# Patient Record
Sex: Female | Born: 1960 | Race: Black or African American | Hispanic: No | Smoking: Current every day smoker
Health system: Southern US, Community
[De-identification: ages and names within clinical notes are randomized; demographics above are authoritative.]

## PROBLEM LIST (undated history)

## (undated) DIAGNOSIS — J449 Chronic obstructive pulmonary disease, unspecified: Secondary | ICD-10-CM

## (undated) DIAGNOSIS — I10 Essential (primary) hypertension: Secondary | ICD-10-CM

## (undated) DIAGNOSIS — G5600 Carpal tunnel syndrome, unspecified upper limb: Secondary | ICD-10-CM

## (undated) DIAGNOSIS — M1990 Unspecified osteoarthritis, unspecified site: Secondary | ICD-10-CM

## (undated) HISTORY — PX: OTHER SURGICAL HISTORY: SHX169

## (undated) HISTORY — PX: TUBAL LIGATION: SHX77

---

## 2010-07-27 ENCOUNTER — Emergency Department (HOSPITAL_COMMUNITY)
Admission: EM | Admit: 2010-07-27 | Discharge: 2010-07-27 | Disposition: A | Discharge status: Home / Self Care | Payer: Medicaid - Out of State | Attending: Emergency Medicine | Admitting: Emergency Medicine

## 2010-07-27 DIAGNOSIS — M069 Rheumatoid arthritis, unspecified: Secondary | ICD-10-CM | POA: Insufficient documentation

## 2010-07-27 DIAGNOSIS — Z79899 Other long term (current) drug therapy: Secondary | ICD-10-CM | POA: Insufficient documentation

## 2010-07-27 DIAGNOSIS — M2550 Pain in unspecified joint: Secondary | ICD-10-CM | POA: Insufficient documentation

## 2011-04-10 ENCOUNTER — Emergency Department (HOSPITAL_COMMUNITY)
Admission: EM | Admit: 2011-04-10 | Discharge: 2011-04-10 | Disposition: A | Discharge status: Home / Self Care | Payer: Medicaid Other | Attending: Emergency Medicine | Admitting: Emergency Medicine

## 2011-04-10 ENCOUNTER — Encounter (HOSPITAL_COMMUNITY): Payer: Self-pay | Admitting: Nurse Practitioner

## 2011-04-10 ENCOUNTER — Emergency Department (HOSPITAL_COMMUNITY): Payer: Medicaid Other

## 2011-04-10 DIAGNOSIS — F17200 Nicotine dependence, unspecified, uncomplicated: Secondary | ICD-10-CM | POA: Insufficient documentation

## 2011-04-10 DIAGNOSIS — R0602 Shortness of breath: Secondary | ICD-10-CM | POA: Insufficient documentation

## 2011-04-10 DIAGNOSIS — J069 Acute upper respiratory infection, unspecified: Secondary | ICD-10-CM | POA: Insufficient documentation

## 2011-04-10 DIAGNOSIS — M129 Arthropathy, unspecified: Secondary | ICD-10-CM | POA: Insufficient documentation

## 2011-04-10 DIAGNOSIS — R0989 Other specified symptoms and signs involving the circulatory and respiratory systems: Secondary | ICD-10-CM | POA: Insufficient documentation

## 2011-04-10 DIAGNOSIS — R197 Diarrhea, unspecified: Secondary | ICD-10-CM | POA: Insufficient documentation

## 2011-04-10 DIAGNOSIS — R0609 Other forms of dyspnea: Secondary | ICD-10-CM | POA: Insufficient documentation

## 2011-04-10 DIAGNOSIS — R52 Pain, unspecified: Secondary | ICD-10-CM | POA: Insufficient documentation

## 2011-04-10 DIAGNOSIS — IMO0001 Reserved for inherently not codable concepts without codable children: Secondary | ICD-10-CM | POA: Insufficient documentation

## 2011-04-10 DIAGNOSIS — R509 Fever, unspecified: Secondary | ICD-10-CM | POA: Insufficient documentation

## 2011-04-10 DIAGNOSIS — R5381 Other malaise: Secondary | ICD-10-CM | POA: Insufficient documentation

## 2011-04-10 DIAGNOSIS — Z79899 Other long term (current) drug therapy: Secondary | ICD-10-CM | POA: Insufficient documentation

## 2011-04-10 DIAGNOSIS — R059 Cough, unspecified: Secondary | ICD-10-CM | POA: Insufficient documentation

## 2011-04-10 DIAGNOSIS — R05 Cough: Secondary | ICD-10-CM | POA: Insufficient documentation

## 2011-04-10 HISTORY — DX: Unspecified osteoarthritis, unspecified site: M19.90

## 2011-04-10 HISTORY — DX: Carpal tunnel syndrome, unspecified upper limb: G56.00

## 2011-04-10 MED ORDER — ALBUTEROL SULFATE (5 MG/ML) 0.5% IN NEBU
2.5000 mg | INHALATION_SOLUTION | RESPIRATORY_TRACT | Status: AC
  Administered 2011-04-10: 2.5 mg via RESPIRATORY_TRACT
  Filled 2011-04-10: qty 0.5

## 2011-04-10 MED ORDER — AZITHROMYCIN 250 MG PO TABS: 250.0000 mg | ORAL_TABLET | Freq: Every day | ORAL | Status: AC

## 2011-04-10 MED ORDER — ALBUTEROL SULFATE HFA 108 (90 BASE) MCG/ACT IN AERS: 2.0000 | INHALATION_SPRAY | Freq: Four times a day (QID) | RESPIRATORY_TRACT | Status: DC

## 2011-04-10 MED ORDER — PREDNISONE 20 MG PO TABS
60.0000 mg | ORAL_TABLET | Freq: Once | ORAL | Status: AC
  Administered 2011-04-10: 60 mg via ORAL
  Filled 2011-04-10: qty 3

## 2011-04-10 MED ORDER — AZITHROMYCIN 250 MG PO TABS
500.0000 mg | ORAL_TABLET | Freq: Once | ORAL | Status: AC
  Administered 2011-04-10: 500 mg via ORAL
  Filled 2011-04-10: qty 2

## 2011-04-10 MED ORDER — PREDNISONE 10 MG PO TABS: 50.0000 mg | ORAL_TABLET | Freq: Every day | ORAL | Status: AC

## 2011-04-10 MED ORDER — KETOROLAC TROMETHAMINE 30 MG/ML IJ SOLN
30.0000 mg | Freq: Once | INTRAMUSCULAR | Status: AC
  Administered 2011-04-10: 30 mg via INTRAVENOUS
  Filled 2011-04-10: qty 1

## 2011-04-10 NOTE — ED Notes (Signed)
C/o sob, body aches and diarrhea x 2 days. Able to speak in full unlabored sentences

## 2011-04-10 NOTE — ED Provider Notes (Signed)
History     CSN: [id number]  Arrival date & time 04/10/11  1016   First MD Initiated Contact with Patient 04/10/11 1106      Chief Complaint  Patient presents with  . Shortness of Breath  . Generalized Body Aches  . Diarrhea    HPI The patient presents with 2-3 days of complaints.  Her symptoms began gradually.  Since onset she has had fatigue, dyspnea, cough, diffuse myalgia, diarrhea.  She notes no relief with OTC medications.  Symptoms seem to be worsening on their own.  She also complains of fevers and chills, though there is no objective fever.  She denies any confusion, disorientation, vomiting, visual loss or changes.  Past Medical History  Diagnosis Date  . Arthritis   . Carpal tunnel syndrome     Past Surgical History  Procedure Date  . Tubal ligation     No family history on file.  History  Substance Use Topics  . Smoking status: Current Everyday Smoker -- 0.5 packs/day  . Smokeless tobacco: Not on file  . Alcohol Use: No    OB History    Grav Para Term Preterm Abortions TAB SAB Ect Mult Living                  Review of Systems  Constitutional:       HPI  HENT:       HPI otherwise negative  Eyes: Negative.   Respiratory:       HPI, otherwise negative  Cardiovascular:       HPI, otherwise nmegative  Gastrointestinal: Negative for vomiting.  Genitourinary:       HPI, otherwise negative  Musculoskeletal:       HPI, otherwise negative  Skin: Negative.   Neurological: Negative for syncope.    Allergies  Review of patient's allergies indicates no known allergies.  Home Medications   Current Outpatient Rx  Name Route Sig Dispense Refill  . DESOXIMETASONE 0.25 % EX CREA Topical Apply 1 application topically 2 (two) times daily as needed. For rash    . GABAPENTIN 300 MG PO CAPS Oral Take 300 mg by mouth 3 (three) times daily.    Marland Kitchen HYDROXYCHLOROQUINE SULFATE 200 MG PO TABS Oral Take 200 mg by mouth daily.    Marland Kitchen HYDROXYZINE HCL 25 MG PO TABS Oral  Take 25 mg by mouth 3 (three) times daily as needed. For itching.    . MELOXICAM 15 MG PO TABS Oral Take 15 mg by mouth 2 (two) times daily.     . DAYQUIL PO Oral Take 30 mLs by mouth once.      BP 130/78  Pulse 92  Temp(Src) 98.3 F (36.8 C) (Oral)  Resp 20  Ht 5\' 5"  (1.651 m)  Wt 210 lb (95.255 kg)  BMI 34.95 kg/m2  SpO2 96%  Physical Exam  Nursing note and vitals reviewed. Constitutional: She is oriented to person, place, and time. She appears well-developed and well-nourished. No distress.  HENT:  Head: Normocephalic and atraumatic.  Eyes: Conjunctivae and EOM are normal.  Cardiovascular: Normal rate and regular rhythm.   Pulmonary/Chest: Effort normal. No stridor. No respiratory distress. She has decreased breath sounds. She has wheezes.  Abdominal: She exhibits no distension.  Musculoskeletal: She exhibits no edema.  Neurological: She is alert and oriented to person, place, and time. No cranial nerve deficit.  Skin: Skin is warm and dry.  Psychiatric: She has a normal mood and affect.    ED Course  Procedures (including critical care time)  Labs Reviewed - No data to display No results found.   No diagnosis found.   X-ray reviewed by me MDM  This female presents with several days of myalgias, fatigue, cough and congestion.  On exam the patient is in no distress though there is wheezing with decreased breath sounds bilaterally.  The patient's vital signs are stable.  Patient's x-ray does not demonstrate pneumonia.  The patient had some relief with a mural inhaler and injected Toradol.  The patient's symptoms are most consistent with URI plus minus bronchitis.  There is low suspicion for systemic infection, given the absence of objective fever, any evidence of distress.  Given the patient's smoking history, she was discharged with an albuterol inhaler, steroids, Z-Pak to follow up with her primary care physician.       Gerhard Munch, MD 04/10/11 [id number]

## 2011-04-10 NOTE — ED Notes (Signed)
Patient transported to X-ray 

## 2011-07-23 ENCOUNTER — Emergency Department (HOSPITAL_COMMUNITY)
Admission: EM | Admit: 2011-07-23 | Discharge: 2011-07-23 | Disposition: A | Discharge status: Home / Self Care | Payer: Medicaid Other | Attending: Emergency Medicine | Admitting: Emergency Medicine

## 2011-07-23 ENCOUNTER — Encounter (HOSPITAL_COMMUNITY): Payer: Self-pay | Admitting: Emergency Medicine

## 2011-07-23 DIAGNOSIS — M25579 Pain in unspecified ankle and joints of unspecified foot: Secondary | ICD-10-CM | POA: Insufficient documentation

## 2011-07-23 DIAGNOSIS — M129 Arthropathy, unspecified: Secondary | ICD-10-CM | POA: Insufficient documentation

## 2011-07-23 DIAGNOSIS — F17200 Nicotine dependence, unspecified, uncomplicated: Secondary | ICD-10-CM | POA: Insufficient documentation

## 2011-07-23 DIAGNOSIS — M7989 Other specified soft tissue disorders: Secondary | ICD-10-CM

## 2011-07-23 DIAGNOSIS — Z79899 Other long term (current) drug therapy: Secondary | ICD-10-CM | POA: Insufficient documentation

## 2011-07-23 DIAGNOSIS — M79609 Pain in unspecified limb: Secondary | ICD-10-CM

## 2011-07-23 DIAGNOSIS — M1990 Unspecified osteoarthritis, unspecified site: Secondary | ICD-10-CM

## 2011-07-23 LAB — URINE MICROSCOPIC-ADD ON

## 2011-07-23 LAB — PRO B NATRIURETIC PEPTIDE: Pro B Natriuretic peptide (BNP): 75 pg/mL (ref 0–125)

## 2011-07-23 LAB — BASIC METABOLIC PANEL
BUN: 18 mg/dL (ref 6–23)
CO2: 22 mEq/L (ref 19–32)
Chloride: 106 mEq/L (ref 96–112)
Creatinine, Ser: 0.89 mg/dL (ref 0.50–1.10)
Potassium: 4 mEq/L (ref 3.5–5.1)

## 2011-07-23 LAB — URINALYSIS, ROUTINE W REFLEX MICROSCOPIC
Bilirubin Urine: NEGATIVE
Glucose, UA: NEGATIVE mg/dL
Hgb urine dipstick: NEGATIVE
Ketones, ur: NEGATIVE mg/dL
Specific Gravity, Urine: 1.015 (ref 1.005–1.030)
pH: 6 (ref 5.0–8.0)

## 2011-07-23 MED ORDER — FUROSEMIDE 20 MG PO TABS: 20.0000 mg | ORAL_TABLET | Freq: Two times a day (BID) | ORAL | Status: AC

## 2011-07-23 MED ORDER — HYDROCODONE-ACETAMINOPHEN 5-325 MG PO TABS: 2.0000 | ORAL_TABLET | ORAL | Status: AC | PRN

## 2011-07-23 NOTE — ED Provider Notes (Signed)
History     CSN: [id number]  Arrival date & time 07/23/11  7829   First MD Initiated Contact with Patient 07/23/11 0906      No chief complaint on file.   HPI Pt with hx of RA has increase pain to right leg knee to the ankle since Tuesday. Pt right knee is swollen. Pt has pulses and sensation present. Pt ambulated for 2 hours yesterday and feels that made the problem worse.     Past Medical History  Diagnosis Date  . Arthritis   . Carpal tunnel syndrome     Past Surgical History  Procedure Date  . Tubal ligation     No family history on file.  History  Substance Use Topics  . Smoking status: Current Everyday Smoker -- 0.5 packs/day  . Smokeless tobacco: Not on file  . Alcohol Use: No    OB History    Grav Para Term Preterm Abortions TAB SAB Ect Mult Living                  Review of Systems  All other systems reviewed and are negative.    Allergies  Review of patient's allergies indicates no known allergies.  Home Medications   Current Outpatient Rx  Name Route Sig Dispense Refill  . ALBUTEROL SULFATE HFA 108 (90 BASE) MCG/ACT IN AERS Inhalation Inhale 2 puffs into the lungs every 6 (six) hours as needed. Shortness of breath    . BECLOMETHASONE DIPROPIONATE 80 MCG/ACT IN AERS Inhalation Inhale 1 puff into the lungs as needed. Prevent asthma    . DESOXIMETASONE 0.25 % EX CREA Topical Apply 1 application topically 2 (two) times daily as needed. For rash    . GABAPENTIN 300 MG PO CAPS Oral Take 300 mg by mouth 3 (three) times daily.    Marland Kitchen HYDROXYCHLOROQUINE SULFATE 200 MG PO TABS Oral Take 200 mg by mouth daily.    Marland Kitchen HYDROXYZINE HCL 25 MG PO TABS Oral Take 25 mg by mouth 3 (three) times daily as needed. For itching.    . MELOXICAM 15 MG PO TABS Oral Take 15 mg by mouth 2 (two) times daily.     . FUROSEMIDE 20 MG PO TABS Oral Take 1 tablet (20 mg total) by mouth 2 (two) times daily. 15 tablet 0  . HYDROCODONE-ACETAMINOPHEN 5-325 MG PO TABS Oral Take 2 tablets  by mouth every 4 (four) hours as needed for pain. 10 tablet 0    BP 141/56  Pulse 69  Temp 97.8 F (36.6 C) (Oral)  Resp 20  SpO2 100%  Physical Exam  Nursing note and vitals reviewed. Constitutional: She is oriented to person, place, and time. She appears well-developed and well-nourished. No distress.  HENT:  Head: Normocephalic and atraumatic.  Eyes: Pupils are equal, round, and reactive to light.  Neck: Normal range of motion.  Cardiovascular: Normal rate and intact distal pulses.   Pulmonary/Chest: No respiratory distress.  Abdominal: Normal appearance. She exhibits no distension.  Musculoskeletal:       Right knee: She exhibits swelling.       Right upper leg: She exhibits swelling.       Right lower leg: She exhibits edema.  Neurological: She is alert and oriented to person, place, and time. No cranial nerve deficit.  Skin: Skin is warm and dry. No rash noted.  Psychiatric: She has a normal mood and affect. Her behavior is normal.    ED Course  Procedures (including critical care time)  Venous Doppler duplex of extremities showed no evidence of DVT or Baker's cyst Labs Reviewed  URINALYSIS, ROUTINE W REFLEX MICROSCOPIC - Abnormal; Notable for the following:    Leukocytes, UA MODERATE (*)     All other components within normal limits  BASIC METABOLIC PANEL - Abnormal; Notable for the following:    GFR calc non Af Amer 75 (*)     GFR calc Af Amer 87 (*)     All other components within normal limits  URINE MICROSCOPIC-ADD ON - Abnormal; Notable for the following:    Squamous Epithelial / LPF FEW (*)     Bacteria, UA FEW (*)     All other components within normal limits  PRO B NATRIURETIC PEPTIDE   No results found.   1. Arthritis       MDM          Nelia Shi, MD 07/23/11 1149

## 2011-07-23 NOTE — ED Notes (Signed)
Per Noreene Larsson with [place], pt is negative for DVT.

## 2011-07-23 NOTE — ED Notes (Signed)
Pt unable to get ride, given bus pass.

## 2011-07-23 NOTE — ED Notes (Signed)
Pt with hx of RA has increase pain to right leg knee to the ankle since Tuesday. Pt right knee is swollen. Pt has pulses and sensation present. Pt ambulated for 2 hours yesterday and feels that made the problem worse.

## 2011-07-23 NOTE — Discharge Instructions (Signed)

## 2011-07-23 NOTE — ED Notes (Signed)
Pt came by ambulance, unable to reach family for ride. Pt trying to reach friend.

## 2011-07-23 NOTE — Progress Notes (Signed)
*  PRELIMINARY RESULTS* Vascular Ultrasound Right lower extremity venous duplex has been completed.  Preliminary findings: Right= No evidence of DVT or baker's cyst.  EUNICE, Dandra Shambaugh RDMS 07/23/2011, 9:44 AM

## 2011-09-16 ENCOUNTER — Ambulatory Visit
Admission: RE | Admit: 2011-09-16 | Discharge: 2011-09-16 | Disposition: A | Discharge status: Home / Self Care | Payer: Medicaid Other | Source: Ambulatory Visit | Attending: Family Medicine | Admitting: Family Medicine

## 2011-09-16 ENCOUNTER — Other Ambulatory Visit: Payer: Self-pay | Admitting: Family Medicine

## 2011-09-16 DIAGNOSIS — M25561 Pain in right knee: Secondary | ICD-10-CM

## 2012-03-26 ENCOUNTER — Encounter (HOSPITAL_COMMUNITY): Payer: Self-pay | Admitting: Emergency Medicine

## 2012-03-26 ENCOUNTER — Emergency Department (HOSPITAL_COMMUNITY)
Admission: EM | Admit: 2012-03-26 | Discharge: 2012-03-26 | Disposition: A | Discharge status: Home / Self Care | Payer: Medicaid Other | Attending: Emergency Medicine | Admitting: Emergency Medicine

## 2012-03-26 DIAGNOSIS — Z79899 Other long term (current) drug therapy: Secondary | ICD-10-CM | POA: Insufficient documentation

## 2012-03-26 DIAGNOSIS — F17200 Nicotine dependence, unspecified, uncomplicated: Secondary | ICD-10-CM | POA: Insufficient documentation

## 2012-03-26 DIAGNOSIS — M25549 Pain in joints of unspecified hand: Secondary | ICD-10-CM | POA: Insufficient documentation

## 2012-03-26 DIAGNOSIS — Z760 Encounter for issue of repeat prescription: Secondary | ICD-10-CM

## 2012-03-26 DIAGNOSIS — M069 Rheumatoid arthritis, unspecified: Secondary | ICD-10-CM | POA: Insufficient documentation

## 2012-03-26 DIAGNOSIS — Z8742 Personal history of other diseases of the female genital tract: Secondary | ICD-10-CM | POA: Insufficient documentation

## 2012-03-26 LAB — CBC
HCT: 37 % (ref 36.0–46.0)
Hemoglobin: 12.9 g/dL (ref 12.0–15.0)
MCHC: 34.9 g/dL (ref 30.0–36.0)
RDW: 14.2 % (ref 11.5–15.5)
WBC: 7.3 10*3/uL (ref 4.0–10.5)

## 2012-03-26 LAB — POCT I-STAT, CHEM 8
Chloride: 109 mEq/L (ref 96–112)
Glucose, Bld: 117 mg/dL — ABNORMAL HIGH (ref 70–99)
HCT: 38 % (ref 36.0–46.0)
Hemoglobin: 12.9 g/dL (ref 12.0–15.0)
Potassium: 3.9 mEq/L (ref 3.5–5.1)
Sodium: 144 mEq/L (ref 135–145)

## 2012-03-26 MED ORDER — GABAPENTIN 600 MG PO TABS
600.0000 mg | ORAL_TABLET | Freq: Once | ORAL | Status: AC
  Administered 2012-03-26: 600 mg via ORAL
  Filled 2012-03-26: qty 1

## 2012-03-26 MED ORDER — GABAPENTIN 600 MG PO TABS: 600.0000 mg | ORAL_TABLET | Freq: Three times a day (TID) | ORAL | Status: AC

## 2012-03-26 MED ORDER — HYDROXYCHLOROQUINE SULFATE 200 MG PO TABS
200.0000 mg | ORAL_TABLET | Freq: Once | ORAL | Status: AC
  Administered 2012-03-26: 200 mg via ORAL
  Filled 2012-03-26: qty 1

## 2012-03-26 MED ORDER — MELOXICAM 15 MG PO TABS: 15.0000 mg | ORAL_TABLET | Freq: Every day | ORAL | Status: DC

## 2012-03-26 MED ORDER — TRAMADOL HCL 50 MG PO TABS
50.0000 mg | ORAL_TABLET | Freq: Once | ORAL | Status: AC
  Administered 2012-03-26: 50 mg via ORAL
  Filled 2012-03-26: qty 1

## 2012-03-26 MED ORDER — MELOXICAM 15 MG PO TABS
15.0000 mg | ORAL_TABLET | Freq: Once | ORAL | Status: AC
  Administered 2012-03-26: 15 mg via ORAL
  Filled 2012-03-26: qty 1

## 2012-03-26 MED ORDER — FUROSEMIDE 20 MG PO TABS: 20.0000 mg | ORAL_TABLET | Freq: Two times a day (BID) | ORAL | Status: DC

## 2012-03-26 MED ORDER — TRAMADOL HCL 50 MG PO TABS: 50.0000 mg | ORAL_TABLET | Freq: Four times a day (QID) | ORAL | Status: DC | PRN

## 2012-03-26 MED ORDER — FUROSEMIDE 20 MG PO TABS
20.0000 mg | ORAL_TABLET | Freq: Once | ORAL | Status: AC
  Administered 2012-03-26: 20 mg via ORAL
  Filled 2012-03-26: qty 1

## 2012-03-26 MED ORDER — HYDROXYCHLOROQUINE SULFATE 200 MG PO TABS: 200.0000 mg | ORAL_TABLET | Freq: Every day | ORAL | Status: DC

## 2012-03-26 NOTE — ED Provider Notes (Signed)
History     CSN: [id number]  Arrival date & time 03/26/12  2128   First MD Initiated Contact with Patient 03/26/12 2151      Chief Complaint  Patient presents with  . Joint Pain    (Consider location/radiation/quality/duration/timing/severity/associated sxs/prior treatment) The history is provided by the patient. No language interpreter was used.   Cc: 52 year-old female coming in with joint pain including her knees and fingers. Past medical history of rheumatoid arthritis out of most  her medications for a week and a half. Patient's next appointment at [facility] with Dr. Renae Gloss is March 27. She is asking to get 5 of her medications refilled until then. Denies fever nausea or vomiting.   Past Medical History  Diagnosis Date  . Arthritis   . Carpal tunnel syndrome     Past Surgical History  Procedure Laterality Date  . Tubal ligation      History reviewed. No pertinent family history.  History  Substance Use Topics  . Smoking status: Current Every Day Smoker -- 0.50 packs/day  . Smokeless tobacco: Not on file  . Alcohol Use: No    OB History   Grav Para Term Preterm Abortions TAB SAB Ect Mult Living                  Review of Systems  Constitutional: Negative.  Negative for fever and diaphoresis.  HENT: Negative.   Eyes: Negative.   Respiratory: Negative.  Negative for shortness of breath.   Cardiovascular: Negative.   Gastrointestinal: Negative.  Negative for nausea and vomiting.  Musculoskeletal: Positive for joint swelling.       Right knee swelling  Neurological: Negative.  Negative for dizziness and weakness.  Psychiatric/Behavioral: Negative.   All other systems reviewed and are negative.    Allergies  Review of patient's allergies indicates no known allergies.  Home Medications   Current Outpatient Rx  Name  Route  Sig  Dispense  Refill  . albuterol (PROVENTIL HFA;VENTOLIN HFA) 108 (90 BASE) MCG/ACT inhaler   Inhalation    Inhale 2 puffs into the lungs every 6 (six) hours as needed. Shortness of breath         . beclomethasone (QVAR) 80 MCG/ACT inhaler   Inhalation   Inhale 1 puff into the lungs as needed. Prevent asthma         . desoximetasone (TOPICORT) 0.25 % cream   Topical   Apply 1 application topically 2 (two) times daily as needed. For rash         . furosemide (LASIX) 20 MG tablet   Oral   Take 1 tablet (20 mg total) by mouth 2 (two) times daily.   15 tablet   0   . gabapentin (NEURONTIN) 300 MG capsule   Oral   Take 300 mg by mouth 3 (three) times daily.         . hydroxychloroquine (PLAQUENIL) 200 MG tablet   Oral   Take 200 mg by mouth daily.         . hydrOXYzine (ATARAX/VISTARIL) 25 MG tablet   Oral   Take 25 mg by mouth 3 (three) times daily as needed. For itching.         . meloxicam (MOBIC) 15 MG tablet   Oral   Take 15 mg by mouth 2 (two) times daily.            BP 150/68  Pulse 68  Temp(Src) 98.8 F (37.1 C) (Oral)  Resp 16  SpO2 98%  Physical Exam  Nursing note and vitals reviewed. Constitutional: She is oriented to person, place, and time. She appears well-developed and well-nourished.  HENT:  Head: Normocephalic and atraumatic.  Eyes: Conjunctivae and EOM are normal. Pupils are equal, round, and reactive to light.  Neck: Normal range of motion. Neck supple.  Cardiovascular: Normal rate.   Pulmonary/Chest: Effort normal.  Abdominal: Soft.  Musculoskeletal: Normal range of motion. She exhibits edema and tenderness.  Right anterior knee edema cool to touch and swelling to her right hand index and third fingers cool to touch  Neurological: She is alert and oriented to person, place, and time. She has normal reflexes.  Skin: Skin is warm and dry.  Psychiatric: She has a normal mood and affect.    ED Course  Procedures (including critical care time)  Labs Reviewed  CBC  POCT I-STAT, CHEM 8   No results found.   No diagnosis  found.    MDM  R/A out of meds.  Prescriptions for a mother ultrasound white female in Neurontin and Lasix. Patient will followup with her PCP next month. Labs unremarkable today no infected joints.   Labs Reviewed  POCT I-STAT, CHEM 8 - Abnormal; Notable for the following:    Glucose, Bld 117 (*)    All other components within normal limits  CBC          Remi Haggard, NP 03/26/12 2256

## 2012-03-26 NOTE — ED Notes (Signed)
Patient here with husband.  Patient states that she is having joint pain in arms and legs.  Patient is CAOx3.

## 2012-03-27 NOTE — ED Provider Notes (Signed)
Medical screening examination/treatment/procedure(s) were performed by non-physician practitioner and as supervising physician I was immediately available for consultation/collaboration.   Carleene Cooper III, MD 03/27/12 2004

## 2012-05-05 ENCOUNTER — Other Ambulatory Visit: Payer: Self-pay | Admitting: Nurse Practitioner

## 2012-05-05 ENCOUNTER — Ambulatory Visit
Admission: RE | Admit: 2012-05-05 | Discharge: 2012-05-05 | Disposition: A | Discharge status: Home / Self Care | Payer: Medicaid Other | Source: Ambulatory Visit | Attending: Nurse Practitioner | Admitting: Nurse Practitioner

## 2012-05-05 DIAGNOSIS — R0602 Shortness of breath: Secondary | ICD-10-CM

## 2012-05-05 DIAGNOSIS — J449 Chronic obstructive pulmonary disease, unspecified: Secondary | ICD-10-CM

## 2012-05-05 DIAGNOSIS — R062 Wheezing: Secondary | ICD-10-CM

## 2012-09-01 ENCOUNTER — Other Ambulatory Visit: Payer: Self-pay | Admitting: Internal Medicine

## 2012-09-01 DIAGNOSIS — Z1231 Encounter for screening mammogram for malignant neoplasm of breast: Secondary | ICD-10-CM

## 2012-09-08 ENCOUNTER — Ambulatory Visit
Admission: RE | Admit: 2012-09-08 | Discharge: 2012-09-08 | Disposition: A | Discharge status: Home / Self Care | Payer: Medicaid Other | Source: Ambulatory Visit | Attending: Internal Medicine | Admitting: Internal Medicine

## 2012-09-08 DIAGNOSIS — Z1231 Encounter for screening mammogram for malignant neoplasm of breast: Secondary | ICD-10-CM

## 2013-01-01 ENCOUNTER — Emergency Department (HOSPITAL_COMMUNITY): Payer: No Typology Code available for payment source

## 2013-01-01 ENCOUNTER — Encounter (HOSPITAL_COMMUNITY): Payer: Self-pay | Admitting: Emergency Medicine

## 2013-01-01 ENCOUNTER — Emergency Department (HOSPITAL_COMMUNITY)
Admission: EM | Admit: 2013-01-01 | Discharge: 2013-01-01 | Disposition: A | Discharge status: Home / Self Care | Payer: No Typology Code available for payment source | Attending: Emergency Medicine | Admitting: Emergency Medicine

## 2013-01-01 DIAGNOSIS — M25539 Pain in unspecified wrist: Secondary | ICD-10-CM | POA: Insufficient documentation

## 2013-01-01 DIAGNOSIS — I10 Essential (primary) hypertension: Secondary | ICD-10-CM | POA: Insufficient documentation

## 2013-01-01 DIAGNOSIS — F17200 Nicotine dependence, unspecified, uncomplicated: Secondary | ICD-10-CM | POA: Insufficient documentation

## 2013-01-01 DIAGNOSIS — Z79899 Other long term (current) drug therapy: Secondary | ICD-10-CM | POA: Insufficient documentation

## 2013-01-01 DIAGNOSIS — M545 Low back pain, unspecified: Secondary | ICD-10-CM | POA: Insufficient documentation

## 2013-01-01 DIAGNOSIS — Y939 Activity, unspecified: Secondary | ICD-10-CM | POA: Insufficient documentation

## 2013-01-01 DIAGNOSIS — Z8739 Personal history of other diseases of the musculoskeletal system and connective tissue: Secondary | ICD-10-CM | POA: Insufficient documentation

## 2013-01-01 DIAGNOSIS — M129 Arthropathy, unspecified: Secondary | ICD-10-CM | POA: Insufficient documentation

## 2013-01-01 DIAGNOSIS — M62838 Other muscle spasm: Secondary | ICD-10-CM | POA: Insufficient documentation

## 2013-01-01 DIAGNOSIS — Y92410 Unspecified street and highway as the place of occurrence of the external cause: Secondary | ICD-10-CM | POA: Insufficient documentation

## 2013-01-01 HISTORY — DX: Essential (primary) hypertension: I10

## 2013-01-01 MED ORDER — OXYCODONE-ACETAMINOPHEN 5-325 MG PO TABS: 2.0000 | ORAL_TABLET | ORAL | Status: DC | PRN

## 2013-01-01 MED ORDER — DIAZEPAM 5 MG PO TABS: 5.0000 mg | ORAL_TABLET | Freq: Two times a day (BID) | ORAL | Status: DC

## 2013-01-01 MED ORDER — DIAZEPAM 5 MG PO TABS
5.0000 mg | ORAL_TABLET | Freq: Once | ORAL | Status: AC
  Administered 2013-01-01: 5 mg via ORAL
  Filled 2013-01-01: qty 1

## 2013-01-01 MED ORDER — KETOROLAC TROMETHAMINE 60 MG/2ML IM SOLN
60.0000 mg | Freq: Once | INTRAMUSCULAR | Status: AC
  Administered 2013-01-01: 60 mg via INTRAMUSCULAR
  Filled 2013-01-01: qty 2

## 2013-01-01 NOTE — ED Notes (Signed)
Contacted xray regarding delay. Provider and pt updated

## 2013-01-01 NOTE — ED Notes (Signed)
Pt returned from xray
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
## 2013-01-01 NOTE — ED Notes (Signed)
Pt comfortable with d/c and f/u instructions. Prescriptions x2. 
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
## 2013-01-02 NOTE — ED Provider Notes (Signed)
Medical screening examination/treatment/procedure(s) were performed by non-physician practitioner and as supervising physician I was immediately available for consultation/collaboration.  Damyon Mullane B. Amra Shukla, MD 01/02/13 0706 

## 2013-01-02 NOTE — ED Provider Notes (Signed)
Medical screening examination/treatment/procedure(s) were performed by non-physician practitioner and as supervising physician I was immediately available for consultation/collaboration.  EKG Interpretation   None         Deshone Lyssy J Morene Cecilio, MD 01/02/13 2246 

## 2013-08-20 ENCOUNTER — Other Ambulatory Visit: Payer: Self-pay

## 2013-08-20 DIAGNOSIS — Z1231 Encounter for screening mammogram for malignant neoplasm of breast: Secondary | ICD-10-CM

## 2013-09-10 ENCOUNTER — Ambulatory Visit: Payer: Medicaid Other

## 2013-09-12 ENCOUNTER — Ambulatory Visit: Payer: Medicaid Other

## 2013-09-20 ENCOUNTER — Ambulatory Visit
Admission: RE | Admit: 2013-09-20 | Discharge: 2013-09-20 | Disposition: A | Discharge status: Home / Self Care | Payer: Medicaid Other | Source: Ambulatory Visit

## 2013-09-20 DIAGNOSIS — Z1231 Encounter for screening mammogram for malignant neoplasm of breast: Secondary | ICD-10-CM

## 2014-05-16 ENCOUNTER — Emergency Department (HOSPITAL_COMMUNITY): Payer: Medicaid Other

## 2014-05-16 ENCOUNTER — Encounter (HOSPITAL_COMMUNITY): Payer: Self-pay | Admitting: *Deleted

## 2014-05-16 ENCOUNTER — Emergency Department (HOSPITAL_COMMUNITY)
Admission: EM | Admit: 2014-05-16 | Discharge: 2014-05-16 | Disposition: A | Discharge status: Home / Self Care | Payer: Medicaid Other | Attending: Emergency Medicine | Admitting: Emergency Medicine

## 2014-05-16 DIAGNOSIS — Z79899 Other long term (current) drug therapy: Secondary | ICD-10-CM | POA: Diagnosis not present

## 2014-05-16 DIAGNOSIS — R079 Chest pain, unspecified: Secondary | ICD-10-CM | POA: Diagnosis not present

## 2014-05-16 DIAGNOSIS — Z8669 Personal history of other diseases of the nervous system and sense organs: Secondary | ICD-10-CM | POA: Insufficient documentation

## 2014-05-16 DIAGNOSIS — Z720 Tobacco use: Secondary | ICD-10-CM | POA: Diagnosis not present

## 2014-05-16 DIAGNOSIS — Z7952 Long term (current) use of systemic steroids: Secondary | ICD-10-CM | POA: Insufficient documentation

## 2014-05-16 DIAGNOSIS — M1990 Unspecified osteoarthritis, unspecified site: Secondary | ICD-10-CM | POA: Diagnosis not present

## 2014-05-16 DIAGNOSIS — J441 Chronic obstructive pulmonary disease with (acute) exacerbation: Secondary | ICD-10-CM | POA: Diagnosis not present

## 2014-05-16 DIAGNOSIS — I10 Essential (primary) hypertension: Secondary | ICD-10-CM | POA: Diagnosis not present

## 2014-05-16 DIAGNOSIS — M7918 Myalgia, other site: Secondary | ICD-10-CM

## 2014-05-16 DIAGNOSIS — J449 Chronic obstructive pulmonary disease, unspecified: Secondary | ICD-10-CM

## 2014-05-16 LAB — CBC
HCT: 41.3 % (ref 36.0–46.0)
Hemoglobin: 14.4 g/dL (ref 12.0–15.0)
MCH: 31.4 pg (ref 26.0–34.0)
MCHC: 34.9 g/dL (ref 30.0–36.0)
MCV: 90.2 fL (ref 78.0–100.0)
PLATELETS: 156 10*3/uL (ref 150–400)
RBC: 4.58 MIL/uL (ref 3.87–5.11)
RDW: 13.5 % (ref 11.5–15.5)
WBC: 8.6 10*3/uL (ref 4.0–10.5)

## 2014-05-16 LAB — COMPREHENSIVE METABOLIC PANEL
ALT: 41 U/L — AB (ref 0–35)
ANION GAP: 6 (ref 5–15)
AST: 35 U/L (ref 0–37)
Albumin: 2.7 g/dL — ABNORMAL LOW (ref 3.5–5.2)
Alkaline Phosphatase: 78 U/L (ref 39–117)
BILIRUBIN TOTAL: 0.4 mg/dL (ref 0.3–1.2)
BUN: 12 mg/dL (ref 6–23)
CHLORIDE: 110 mmol/L (ref 96–112)
CO2: 22 mmol/L (ref 19–32)
Calcium: 7.9 mg/dL — ABNORMAL LOW (ref 8.4–10.5)
Creatinine, Ser: 0.81 mg/dL (ref 0.50–1.10)
GFR calc Af Amer: >90 mL/min (ref >90)
GFR calc non Af Amer: 82 mL/min — ABNORMAL LOW (ref >90)
GLUCOSE: 98 mg/dL (ref 70–99)
POTASSIUM: 3.7 mmol/L (ref 3.5–5.1)
SODIUM: 138 mmol/L (ref 135–145)
Total Protein: 4.9 g/dL — ABNORMAL LOW (ref 6.0–8.3)

## 2014-05-16 LAB — BRAIN NATRIURETIC PEPTIDE: B NATRIURETIC PEPTIDE 5: 44.6 pg/mL (ref 0.0–100.0)

## 2014-05-16 LAB — TROPONIN I

## 2014-05-16 MED ORDER — IBUPROFEN 600 MG PO TABS: 600.0000 mg | ORAL_TABLET | Freq: Four times a day (QID) | ORAL | Status: DC | PRN

## 2014-05-16 MED ORDER — MORPHINE SULFATE 4 MG/ML IJ SOLN
4.0000 mg | Freq: Once | INTRAMUSCULAR | Status: AC
  Administered 2014-05-16: 4 mg via INTRAVENOUS
  Filled 2014-05-16: qty 1

## 2014-05-16 MED ORDER — KETOROLAC TROMETHAMINE 15 MG/ML IJ SOLN
15.0000 mg | Freq: Once | INTRAMUSCULAR | Status: AC
  Administered 2014-05-16: 15 mg via INTRAVENOUS
  Filled 2014-05-16: qty 1

## 2014-05-16 NOTE — Discharge Instructions (Signed)
Chest Pain (Nonspecific) °It is often hard to give a specific diagnosis for the cause of chest pain. There is always a chance that your pain could be related to something serious, such as a heart attack or a blood clot in the lungs. You need to follow up with your health care provider for further evaluation. °CAUSES  °· Heartburn. °· Pneumonia or bronchitis. °· Anxiety or stress. °· Inflammation around your heart (pericarditis) or lung (pleuritis or pleurisy). °· A blood clot in the lung. °· A collapsed lung (pneumothorax). It can develop suddenly on its own (spontaneous pneumothorax) or from trauma to the chest. °· Shingles infection (herpes zoster virus). °The chest wall is composed of bones, muscles, and cartilage. Any of these can be the source of the pain. °· The bones can be bruised by injury. °· The muscles or cartilage can be strained by coughing or overwork. °· The cartilage can be affected by inflammation and become sore (costochondritis). °DIAGNOSIS  °Lab tests or other studies may be needed to find the cause of your pain. Your health care provider may have you take a test called an ambulatory electrocardiogram (ECG). An ECG records your heartbeat patterns over a 24-hour period. You may also have other tests, such as: °· Transthoracic echocardiogram (TTE). During echocardiography, sound waves are used to evaluate how blood flows through your heart. °· Transesophageal echocardiogram (TEE). °· Cardiac monitoring. This allows your health care provider to monitor your heart rate and rhythm in real time. °· Holter monitor. This is a portable device that records your heartbeat and can help diagnose heart arrhythmias. It allows your health care provider to track your heart activity for several days, if needed. °· Stress tests by exercise or by giving medicine that makes the heart beat faster. °TREATMENT  °· Treatment depends on what may be causing your chest pain. Treatment may include: °¨ Acid blockers for  heartburn. °¨ Anti-inflammatory medicine. °¨ Pain medicine for inflammatory conditions. °¨ Antibiotics if an infection is present. °· You may be advised to change lifestyle habits. This includes stopping smoking and avoiding alcohol, caffeine, and chocolate. °· You may be advised to keep your head raised (elevated) when sleeping. This reduces the chance of acid going backward from your stomach into your esophagus. °Most of the time, nonspecific chest pain will improve within 2-3 days with rest and mild pain medicine.  °HOME CARE INSTRUCTIONS  °· If antibiotics were prescribed, take them as directed. Finish them even if you start to feel better. °· For the next few days, avoid physical activities that bring on chest pain. Continue physical activities as directed. °· Do not use any tobacco products, including cigarettes, chewing tobacco, or electronic cigarettes. °· Avoid drinking alcohol. °· Only take medicine as directed by your health care provider. °· Follow your health care provider's suggestions for further testing if your chest pain does not go away. °· Keep any follow-up appointments you made. If you do not go to an appointment, you could develop lasting (chronic) problems with pain. If there is any problem keeping an appointment, call to reschedule. °SEEK MEDICAL CARE IF:  °· Your chest pain does not go away, even after treatment. °· You have a rash with blisters on your chest. °· You have a fever. °SEEK IMMEDIATE MEDICAL CARE IF:  °· You have increased chest pain or pain that spreads to your arm, neck, jaw, back, or abdomen. °· You have shortness of breath. °· You have an increasing cough, or you cough   up blood. °· You have severe back or abdominal pain. °· You feel nauseous or vomit. °· You have severe weakness. °· You faint. °· You have chills. °This is an emergency. Do not wait to see if the pain will go away. Get medical help at once. Call your local emergency services (911 in [place] Do not drive  yourself to the hospital. °MAKE SURE YOU:  °· Understand these instructions. °· Will watch your condition. °· Will get help right away if you are not doing well or get worse. °Document Released: 10/28/2004 Document Revised: 01/23/2013 Document Reviewed: 08/24/2007 °[employer]® Patient Information ©2015 [employer]. This information is not intended to replace advice given to you by your health care provider. Make sure you discuss any questions you have with your health care provider. °Musculoskeletal Pain °Musculoskeletal pain is muscle and boney aches and pains. These pains can occur in any part of the body. Your caregiver may treat you without knowing the cause of the pain. They may treat you if blood or urine tests, X-rays, and other tests were normal.  °CAUSES °There is often not a definite cause or reason for these pains. These pains may be caused by a type of germ (virus). The discomfort may also come from overuse. Overuse includes working out too hard when your body is not fit. Boney aches also come from weather changes. Bone is sensitive to atmospheric pressure changes. °HOME CARE INSTRUCTIONS  °· Ask when your test results will be ready. Make sure you get your test results. °· Only take over-the-counter or prescription medicines for pain, discomfort, or fever as directed by your caregiver. If you were given medications for your condition, do not drive, operate machinery or power tools, or sign legal documents for 24 hours. Do not drink alcohol. Do not take sleeping pills or other medications that may interfere with treatment. °· Continue all activities unless the activities cause more pain. When the pain lessens, slowly resume normal activities. Gradually increase the intensity and duration of the activities or exercise. °· During periods of severe pain, bed rest may be helpful. Lay or sit in any position that is comfortable. °· Putting ice on the injured area. °¨ Put ice in a bag. °¨ Place a towel between  your skin and the bag. °¨ Leave the ice on for 15 to 20 minutes, 3 to 4 times a day. °· Follow up with your caregiver for continued problems and no reason can be found for the pain. If the pain becomes worse or does not go away, it may be necessary to repeat tests or do additional testing. Your caregiver may need to look further for a possible cause. °SEEK IMMEDIATE MEDICAL CARE IF: °· You have pain that is getting worse and is not relieved by medications. °· You develop chest pain that is associated with shortness or breath, sweating, feeling sick to your stomach (nauseous), or throw up (vomit). °· Your pain becomes localized to the abdomen. °· You develop any new symptoms that seem different or that concern you. °MAKE SURE YOU:  °· Understand these instructions. °· Will watch your condition. °· Will get help right away if you are not doing well or get worse. °Document Released: 01/18/2005 Document Revised: 04/12/2011 Document Reviewed: 09/22/2012 °[employer]® Patient Information ©2015 [employer]. This information is not intended to replace advice given to you by your health care provider. Make sure you discuss any questions you have with your health care provider. ° °

## 2014-05-16 NOTE — ED Notes (Signed)
Pt with L sided chest pain and sob since yesterday.  Pain increases with breathing and palpation.  Pt states even though she was in pain she donated plasma today.

## 2014-05-16 NOTE — ED Notes (Signed)
Pt verbalized understanding of d/c instructions and has no further questions.  

## 2014-05-16 NOTE — ED Notes (Signed)
Pt ambulated well down the hallway at 100%.

## 2014-05-16 NOTE — ED Provider Notes (Signed)
CSN: [id number]     Arrival date & time 05/16/14  1503 History   First MD Initiated Contact with Patient 05/16/14 1528     Chief Complaint  Patient presents with  . Chest Pain  . Shortness of Breath     (Consider location/radiation/quality/duration/timing/severity/associated sxs/prior Treatment) HPI   This is a 54 yo female with PMH HTN, gout, fluid overload, COPD, presenting with CP.  Onset two days ago.  Located in the left inframammary area.  Intermittent, only hurts when turning her trunk a certain way and coughing.  Characterized as sharp.  Alleviated but not resolved with tylenol 3.  Negative for radiation.  Positive for increased DOE after discontinuing her lasix 2 months ago because "I am tired of taking so many pills."  Positive for cough productive of green sputum for 3 months.  Negative for nausea, vomiting, diaphoresis.      Past Medical History  Diagnosis Date  . Carpal tunnel syndrome   . Hypertension   . Arthritis     rheumatoid and osteo   Past Surgical History  Procedure Laterality Date  . Tubal ligation    . Wrist sugery     No family history on file. History  Substance Use Topics  . Smoking status: Current Every Day Smoker -- 0.50 packs/day  . Smokeless tobacco: Not on file  . Alcohol Use: No   OB History    No data available     Review of Systems  Constitutional: Negative for fever and chills.  HENT: Negative for facial swelling.   Eyes: Negative for photophobia and pain.  Respiratory: Positive for cough and shortness of breath.   Cardiovascular: Positive for chest pain.  Gastrointestinal: Negative for nausea, vomiting and abdominal pain.  Genitourinary: Negative for dysuria.  Musculoskeletal: Negative for arthralgias.  Skin: Negative for rash and wound.  Neurological: Negative for seizures.  Hematological: Negative for adenopathy.      Allergies  Review of patient's allergies indicates no known allergies.  Home Medications   Prior to  Admission medications   Medication Sig Start Date End Date Taking? Authorizing Provider  albuterol (PROVENTIL HFA;VENTOLIN HFA) 108 (90 BASE) MCG/ACT inhaler Inhale 2 puffs into the lungs every 6 (six) hours as needed. Shortness of breath   Yes Historical Provider, MD  beclomethasone (QVAR) 80 MCG/ACT inhaler Inhale 1 puff into the lungs as needed. Prevent asthma   Yes Historical Provider, MD  desoximetasone (TOPICORT) 0.25 % cream Apply 1 application topically 2 (two) times daily as needed. For rash   Yes Historical Provider, MD  gabapentin (NEURONTIN) 600 MG tablet Take 1 tablet (600 mg total) by mouth 3 (three) times daily. 03/26/12  Yes Jethro Bastos, NP  hydroxychloroquine (PLAQUENIL) 200 MG tablet Take 200 mg by mouth daily.   Yes Historical Provider, MD  hydrOXYzine (ATARAX/VISTARIL) 25 MG tablet Take 25 mg by mouth 3 (three) times daily as needed. For itching.   Yes Historical Provider, MD  diazepam (VALIUM) 5 MG tablet Take 1 tablet (5 mg total) by mouth 2 (two) times daily. Patient not taking: Reported on 05/16/2014 01/01/13   Irish Elders, NP  furosemide (LASIX) 20 MG tablet Take 1 tablet (20 mg total) by mouth 2 (two) times daily. 07/23/11 07/22/12  Nelva Nay, MD  furosemide (LASIX) 20 MG tablet Take 1 tablet (20 mg total) by mouth 2 (two) times daily. Patient not taking: Reported on 05/16/2014 03/26/12   Jethro Bastos, NP  oxyCODONE-acetaminophen (PERCOCET/ROXICET) 5-325 MG per tablet Take  2 tablets by mouth every 4 (four) hours as needed. Patient not taking: Reported on 05/16/2014 01/01/13   Irish Elders, NP  traMADol (ULTRAM) 50 MG tablet Take 1 tablet (50 mg total) by mouth every 6 (six) hours as needed for pain. Patient not taking: Reported on 05/16/2014 03/26/12   Jethro Bastos, NP   BP 115/77 mmHg  Pulse 64  Temp(Src) 97.9 F (36.6 C) (Oral)  Resp 20  Ht 5\' 5"  (1.651 m)  Wt 234 lb 6.4 oz (106.323 kg)  BMI 39.01 kg/m2  SpO2 97% Physical Exam  Constitutional: She is  oriented to person, place, and time. She appears well-developed and well-nourished. No distress.  HENT:  Head: Normocephalic and atraumatic.  Mouth/Throat: No oropharyngeal exudate.  Eyes: Conjunctivae are normal. Pupils are equal, round, and reactive to light. No scleral icterus.  Neck: Normal range of motion. No tracheal deviation present. No thyromegaly present.  Cardiovascular: Normal rate, regular rhythm and normal heart sounds.  Exam reveals no gallop and no friction rub.   No murmur heard. Pulmonary/Chest: Effort normal. No stridor. No respiratory distress. She has no wheezes. She exhibits tenderness (exquisite, left inframammary area).  rhonchi  Abdominal: Soft. She exhibits no distension and no mass. There is no tenderness. There is no rebound and no guarding.  Musculoskeletal: Normal range of motion. She exhibits edema (very mild, BLEs).  Neurological: She is alert and oriented to person, place, and time.  Skin: Skin is warm and dry. She is not diaphoretic.    ED Course  Procedures (including critical care time) Labs Review Labs Reviewed  COMPREHENSIVE METABOLIC PANEL - Abnormal; Notable for the following:    Calcium 7.9 (*)    Total Protein 4.9 (*)    Albumin 2.7 (*)    ALT 41 (*)    GFR calc non Af Amer 82 (*)    All other components within normal limits  CBC  TROPONIN I  BRAIN NATRIURETIC PEPTIDE    Imaging Review Dg Chest 2 View  05/16/2014   CLINICAL DATA:  Left chest pain for 2 days, shortness of breath with cough for 2 months. Dizziness.  EXAM: CHEST  2 VIEW  COMPARISON:  05/05/2012  FINDINGS: Heart at the upper limits of normal in size, mediastinal contours are normal. There is unchanged hyperinflation. Unchanged bibasilar interstitial prominence. Pulmonary vasculature is normal. No consolidation, pleural effusion, or pneumothorax. No acute osseous abnormalities are seen.  IMPRESSION: Stable bibasilar and interstitial prominence and hyperinflation. No localizing  pulmonary process.   Electronically Signed   By: 07/05/2012 M.D.   On: 05/16/2014 20:13     EKG Interpretation   Date/Time:  Thursday May 16 2014 15:12:08 EDT Ventricular Rate:  75 PR Interval:  142 QRS Duration: 78 QT Interval:  396 QTC Calculation: 442 R Axis:   44 Text Interpretation:  Normal sinus rhythm with sinus arrhythmia Normal ECG  No old tracing to compare Confirmed by MILLER  MD, BRIAN ([id number]) on  05/16/2014 3:28:55 PM      MDM   Final diagnoses:  Chest pain, unspecified chest pain type  Musculoskeletal pain  Chronic obstructive pulmonary disease, unspecified COPD, unspecified chronic bronchitis type     This is a 54 yo female with PMH HTN, gout, fluid overload, COPD, presenting with CP.  Onset two days ago.  Located in the left inframammary area.  Intermittent, only hurts when turning her trunk a certain way and coughing.  Characterized as sharp.  Alleviated but not resolved with  tylenol 3.  Negative for radiation.  Positive for increased DOE after discontinuing her lasix 2 months ago because "I am tired of taking so many pills."  Positive for cough productive of green sputum for 3 months.  Negative for nausea, vomiting, diaphoresis.      On exam, vitals are stable.  No tachycardia.  Afebrile rectally and orally.  CV is difficult due to girth.  Resp exam is positive for bilateral rhonchi.  She is exquisitely tender to the left inframammary area.  Positive for BLE mild edema.  Negative for unilateral swelling or pain.  EKG is without ischemic changes.  Will attempt to control pain, fu remainder of workup, reevaluate shortly.  Labs, including troponin and BNP, are without acute abnormalities, as is CXR.  Presentation is not consisten with ACS, PE, PTX, PNA, pericarditis, tamponade, dissection, esophageal pathology.  It is consistent with MSK pain, likely caused by persistent cough associated with COPD.  Pain has been alleviated in this department.  Pt stable for  discharge, FU.  All questions answered.  Return precautions given.  I have discussed case and care has been guided by my attending physician, Dr. Hyacinth Meeker.   Loma Boston, MD 05/17/14 0010  Eber Hong, MD 05/17/14 [phone]

## 2014-05-16 NOTE — ED Provider Notes (Signed)
The patient is a 54 year old female, she presents with a complaint of left-sided sharp chest pain located under the left breast, has been going on for 2 days, worse with deep breathing, worse with coughing, worse with any kind of movement including sitting standing changing position reaching with her arm or touching her chest. She denies fevers, she has chronic swelling of the lower extremities, she denies nausea vomiting or diarrhea. On exam the patient has diffuse tenderness over the left chest wall in the bilateral upper abdomen, there is guarding over the chest wall, her lungs have occasional rales at the bases which are intermittent and clear with coughing, she has bilateral 1-2+ pitting edema which appear symmetrical. She is not in distress, she is not tachycardic, her EKG does not show any signs of acute ischemia or tachycardia. Suspect chest wall syndrome, will rule out cardiac etiology or pulmonary etiology with x-ray and labs. Patient is in agreement with the plan.   EKG Interpretation  Date/Time:  Thursday May 16 2014 15:12:08 EDT Ventricular Rate:  75 PR Interval:  142 QRS Duration: 78 QT Interval:  396 QTC Calculation: 442 R Axis:   44 Text Interpretation:  Normal sinus rhythm with sinus arrhythmia Normal ECG No old tracing to compare Confirmed by Abbigael Detlefsen  MD, Tyiana Hill ([id number]) on 05/16/2014 3:28:55 PM      I saw and evaluated the patient, reviewed the resident's note and I agree with the findings and plan.   Final diagnoses:  Chest pain, unspecified chest pain type  Musculoskeletal pain  Chronic obstructive pulmonary disease, unspecified COPD, unspecified chronic bronchitis type      Eber Hong, MD 05/17/14 1140

## 2014-10-03 ENCOUNTER — Encounter (HOSPITAL_COMMUNITY): Payer: Self-pay | Admitting: Emergency Medicine

## 2014-10-03 ENCOUNTER — Emergency Department (HOSPITAL_COMMUNITY): Payer: No Typology Code available for payment source

## 2014-10-03 ENCOUNTER — Emergency Department (HOSPITAL_COMMUNITY)
Admission: EM | Admit: 2014-10-03 | Discharge: 2014-10-03 | Disposition: A | Discharge status: Home / Self Care | Payer: No Typology Code available for payment source | Attending: Emergency Medicine | Admitting: Emergency Medicine

## 2014-10-03 DIAGNOSIS — I10 Essential (primary) hypertension: Secondary | ICD-10-CM | POA: Diagnosis not present

## 2014-10-03 DIAGNOSIS — S2020XA Contusion of thorax, unspecified, initial encounter: Secondary | ICD-10-CM | POA: Insufficient documentation

## 2014-10-03 DIAGNOSIS — S29019A Strain of muscle and tendon of unspecified wall of thorax, initial encounter: Secondary | ICD-10-CM | POA: Diagnosis not present

## 2014-10-03 DIAGNOSIS — M069 Rheumatoid arthritis, unspecified: Secondary | ICD-10-CM | POA: Insufficient documentation

## 2014-10-03 DIAGNOSIS — S22080A Wedge compression fracture of T11-T12 vertebra, initial encounter for closed fracture: Secondary | ICD-10-CM | POA: Insufficient documentation

## 2014-10-03 DIAGNOSIS — Y9389 Activity, other specified: Secondary | ICD-10-CM | POA: Diagnosis not present

## 2014-10-03 DIAGNOSIS — S20219A Contusion of unspecified front wall of thorax, initial encounter: Secondary | ICD-10-CM

## 2014-10-03 DIAGNOSIS — S39012A Strain of muscle, fascia and tendon of lower back, initial encounter: Secondary | ICD-10-CM

## 2014-10-03 DIAGNOSIS — M1990 Unspecified osteoarthritis, unspecified site: Secondary | ICD-10-CM | POA: Insufficient documentation

## 2014-10-03 DIAGNOSIS — S22000A Wedge compression fracture of unspecified thoracic vertebra, initial encounter for closed fracture: Secondary | ICD-10-CM

## 2014-10-03 DIAGNOSIS — Y92410 Unspecified street and highway as the place of occurrence of the external cause: Secondary | ICD-10-CM | POA: Diagnosis not present

## 2014-10-03 DIAGNOSIS — Y998 Other external cause status: Secondary | ICD-10-CM | POA: Diagnosis not present

## 2014-10-03 DIAGNOSIS — Z79899 Other long term (current) drug therapy: Secondary | ICD-10-CM | POA: Diagnosis not present

## 2014-10-03 DIAGNOSIS — S299XXA Unspecified injury of thorax, initial encounter: Secondary | ICD-10-CM | POA: Diagnosis present

## 2014-10-03 DIAGNOSIS — Z720 Tobacco use: Secondary | ICD-10-CM | POA: Insufficient documentation

## 2014-10-03 LAB — CBC
HEMATOCRIT: 39 % (ref 36.0–46.0)
HEMOGLOBIN: 13.5 g/dL (ref 12.0–15.0)
MCH: 30 pg (ref 26.0–34.0)
MCHC: 34.6 g/dL (ref 30.0–36.0)
MCV: 86.7 fL (ref 78.0–100.0)
Platelets: 172 10*3/uL (ref 150–400)
RBC: 4.5 MIL/uL (ref 3.87–5.11)
RDW: 13.2 % (ref 11.5–15.5)
WBC: 6.5 10*3/uL (ref 4.0–10.5)

## 2014-10-03 LAB — BASIC METABOLIC PANEL
ANION GAP: 6 (ref 5–15)
BUN: 15 mg/dL (ref 6–20)
CALCIUM: 9.3 mg/dL (ref 8.9–10.3)
CO2: 24 mmol/L (ref 22–32)
Chloride: 110 mmol/L (ref 101–111)
Creatinine, Ser: 1.01 mg/dL — ABNORMAL HIGH (ref 0.44–1.00)
GFR calc non Af Amer: >60 mL/min (ref >60)
Glucose, Bld: 88 mg/dL (ref 65–99)
Potassium: 3.8 mmol/L (ref 3.5–5.1)
Sodium: 140 mmol/L (ref 135–145)

## 2014-10-03 MED ORDER — TRAMADOL HCL 50 MG PO TABS
50.0000 mg | ORAL_TABLET | Freq: Once | ORAL | Status: AC
  Administered 2014-10-03: 50 mg via ORAL
  Filled 2014-10-03: qty 1

## 2014-10-03 MED ORDER — IBUPROFEN 400 MG PO TABS
400.0000 mg | ORAL_TABLET | Freq: Once | ORAL | Status: AC
  Administered 2014-10-03: 400 mg via ORAL
  Filled 2014-10-03: qty 1

## 2014-10-03 MED ORDER — TRAMADOL HCL 50 MG PO TABS: 50.0000 mg | ORAL_TABLET | Freq: Four times a day (QID) | ORAL | Status: DC | PRN

## 2014-10-03 NOTE — ED Notes (Signed)
Physician removed C-collar

## 2014-10-03 NOTE — ED Notes (Signed)
MD removed C collar

## 2014-10-03 NOTE — ED Notes (Signed)
Steinl ok'd patient having food and I took her graham crackers, peanut butter, and Coke.
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
## 2014-10-03 NOTE — ED Notes (Signed)
Ambulated patient to RR7 to use restroom

## 2014-10-03 NOTE — ED Notes (Signed)
Patient transported to X-ray 
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
## 2014-11-15 ENCOUNTER — Telehealth: Payer: Self-pay | Admitting: *Deleted
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
## 2014-12-04 ENCOUNTER — Other Ambulatory Visit: Payer: Self-pay

## 2014-12-04 DIAGNOSIS — Z1231 Encounter for screening mammogram for malignant neoplasm of breast: Secondary | ICD-10-CM

## 2014-12-19 ENCOUNTER — Ambulatory Visit
Admission: RE | Admit: 2014-12-19 | Discharge: 2014-12-19 | Disposition: A | Discharge status: Home / Self Care | Payer: Medicaid Other | Source: Ambulatory Visit

## 2014-12-19 DIAGNOSIS — Z1231 Encounter for screening mammogram for malignant neoplasm of breast: Secondary | ICD-10-CM

## 2015-04-07 ENCOUNTER — Encounter (HOSPITAL_COMMUNITY): Payer: Self-pay

## 2015-04-07 ENCOUNTER — Emergency Department (HOSPITAL_COMMUNITY)
Admission: EM | Admit: 2015-04-07 | Discharge: 2015-04-07 | Disposition: A | Discharge status: Home / Self Care | Payer: Medicaid Other | Attending: Emergency Medicine | Admitting: Emergency Medicine

## 2015-04-07 DIAGNOSIS — M109 Gout, unspecified: Secondary | ICD-10-CM | POA: Insufficient documentation

## 2015-04-07 DIAGNOSIS — M1990 Unspecified osteoarthritis, unspecified site: Secondary | ICD-10-CM | POA: Insufficient documentation

## 2015-04-07 DIAGNOSIS — J441 Chronic obstructive pulmonary disease with (acute) exacerbation: Secondary | ICD-10-CM | POA: Insufficient documentation

## 2015-04-07 DIAGNOSIS — R42 Dizziness and giddiness: Secondary | ICD-10-CM

## 2015-04-07 DIAGNOSIS — I10 Essential (primary) hypertension: Secondary | ICD-10-CM | POA: Diagnosis not present

## 2015-04-07 DIAGNOSIS — R110 Nausea: Secondary | ICD-10-CM | POA: Diagnosis not present

## 2015-04-07 DIAGNOSIS — H6592 Unspecified nonsuppurative otitis media, left ear: Secondary | ICD-10-CM

## 2015-04-07 DIAGNOSIS — H748X2 Other specified disorders of left middle ear and mastoid: Secondary | ICD-10-CM | POA: Insufficient documentation

## 2015-04-07 DIAGNOSIS — F17200 Nicotine dependence, unspecified, uncomplicated: Secondary | ICD-10-CM | POA: Insufficient documentation

## 2015-04-07 HISTORY — DX: Chronic obstructive pulmonary disease, unspecified: J44.9

## 2015-04-07 LAB — CBC WITH DIFFERENTIAL/PLATELET
BASOS ABS: 0 10*3/uL (ref 0.0–0.1)
Basophils Relative: 1 %
EOS PCT: 0 %
Eosinophils Absolute: 0 10*3/uL (ref 0.0–0.7)
HCT: 37.8 % (ref 36.0–46.0)
HEMOGLOBIN: 13 g/dL (ref 12.0–15.0)
LYMPHS ABS: 1.5 10*3/uL (ref 0.7–4.0)
LYMPHS PCT: 23 %
MCH: 29.8 pg (ref 26.0–34.0)
MCHC: 34.4 g/dL (ref 30.0–36.0)
MCV: 86.7 fL (ref 78.0–100.0)
Monocytes Absolute: 0.5 10*3/uL (ref 0.1–1.0)
Monocytes Relative: 7 %
NEUTROS ABS: 4.3 10*3/uL (ref 1.7–7.7)
NEUTROS PCT: 69 %
PLATELETS: 151 10*3/uL (ref 150–400)
RBC: 4.36 MIL/uL (ref 3.87–5.11)
RDW: 13.2 % (ref 11.5–15.5)
WBC: 6.3 10*3/uL (ref 4.0–10.5)

## 2015-04-07 LAB — COMPREHENSIVE METABOLIC PANEL
ALT: 16 U/L (ref 14–54)
ANION GAP: 7 (ref 5–15)
AST: 20 U/L (ref 15–41)
Albumin: 3.3 g/dL — ABNORMAL LOW (ref 3.5–5.0)
Alkaline Phosphatase: 100 U/L (ref 38–126)
BUN: 11 mg/dL (ref 6–20)
CHLORIDE: 111 mmol/L (ref 101–111)
CO2: 23 mmol/L (ref 22–32)
CREATININE: 0.85 mg/dL (ref 0.44–1.00)
Calcium: 9.1 mg/dL (ref 8.9–10.3)
Glucose, Bld: 116 mg/dL — ABNORMAL HIGH (ref 65–99)
POTASSIUM: 3.9 mmol/L (ref 3.5–5.1)
Sodium: 141 mmol/L (ref 135–145)
Total Bilirubin: 0.5 mg/dL (ref 0.3–1.2)
Total Protein: 6.4 g/dL — ABNORMAL LOW (ref 6.5–8.1)

## 2015-04-07 MED ORDER — MECLIZINE HCL 25 MG PO TABS
25.0000 mg | ORAL_TABLET | Freq: Once | ORAL | Status: AC
  Administered 2015-04-07: 25 mg via ORAL
  Filled 2015-04-07: qty 1

## 2015-04-07 MED ORDER — ONDANSETRON 4 MG PO TBDP
4.0000 mg | ORAL_TABLET | Freq: Once | ORAL | Status: AC
  Administered 2015-04-07: 4 mg via ORAL
  Filled 2015-04-07: qty 1

## 2015-04-07 MED ORDER — DIAZEPAM 5 MG PO TABS: 5.0000 mg | ORAL_TABLET | Freq: Three times a day (TID) | ORAL | Status: AC | PRN

## 2015-04-07 MED ORDER — DIAZEPAM 5 MG PO TABS
5.0000 mg | ORAL_TABLET | Freq: Once | ORAL | Status: AC
  Administered 2015-04-07: 5 mg via ORAL
  Filled 2015-04-07: qty 1
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
## 2015-04-07 NOTE — ED Provider Notes (Signed)
CSN: [id number]     Arrival date & time 04/07/15  9201 History   First MD Initiated Contact with Patient 04/07/15 [phone]     Chief Complaint  Patient presents with  . Dizziness     (Consider location/radiation/quality/duration/timing/severity/associated sxs/prior Treatment) Patient is a 55 y.o. female presenting with dizziness.  Dizziness Quality:  Room spinning Severity:  Moderate Onset quality:  Sudden Duration:  2 hours Timing:  Intermittent Progression:  Waxing and waning Chronicity:  New Context: head movement and standing up   Context: not with loss of consciousness and not with medication   Relieved by:  Being still Worsened by:  Movement, sitting upright and turning head Associated symptoms: nausea and shortness of breath (baseline)   Associated symptoms: no blood in stool, no chest pain, no diarrhea, no headaches, no hearing loss, no palpitations, no syncope, no tinnitus, no vision changes, no vomiting and no weakness   Risk factors: no anemia, no heart disease and no hx of vertigo     Past Medical History  Diagnosis Date  . Carpal tunnel syndrome   . Hypertension   . Arthritis     rheumatoid and osteo  . COPD (chronic obstructive pulmonary disease) [facility])    Past Surgical History  Procedure Laterality Date  . Tubal ligation    . Wrist sugery     History reviewed. No pertinent family history. Social History  Substance Use Topics  . Smoking status: Current Every Day Smoker -- 0.50 packs/day  . Smokeless tobacco: None  . Alcohol Use: No   OB History    No data available     Review of Systems  Constitutional: Negative for fever, chills, appetite change and fatigue.  HENT: Negative for congestion, ear pain, facial swelling, hearing loss, mouth sores, sore throat and tinnitus.   Eyes: Negative for visual disturbance.  Respiratory: Positive for shortness of breath (baseline). Negative for cough and chest tightness.   Cardiovascular: Negative for chest pain,  palpitations and syncope.  Gastrointestinal: Positive for nausea. Negative for vomiting, abdominal pain, diarrhea and blood in stool.  Endocrine: Negative for cold intolerance and heat intolerance.  Genitourinary: Negative for frequency, decreased urine volume and difficulty urinating.  Musculoskeletal: Negative for back pain and neck stiffness.  Skin: Negative for rash.  Neurological: Positive for dizziness. Negative for weakness, light-headedness and headaches.  All other systems reviewed and are negative.     Allergies  Review of patient's allergies indicates no known allergies.  Home Medications   Prior to Admission medications   Medication Sig Start Date End Date Taking? Authorizing Provider  acetaminophen-codeine (TYLENOL #3) 300-30 MG per tablet Take 1 tablet by mouth daily. 07/04/14  Yes Historical Provider, MD  albuterol (PROVENTIL HFA;VENTOLIN HFA) 108 (90 BASE) MCG/ACT inhaler Inhale 2 puffs into the lungs every 6 (six) hours as needed. Shortness of breath   Yes Historical Provider, MD  beclomethasone (QVAR) 80 MCG/ACT inhaler Inhale 1 puff into the lungs as needed. Prevent asthma   Yes Historical Provider, MD  gabapentin (NEURONTIN) 600 MG tablet Take 1 tablet (600 mg total) by mouth 3 (three) times daily. 03/26/12  Yes Jethro Bastos, NP  hydroxychloroquine (PLAQUENIL) 200 MG tablet Take 200 mg by mouth daily.   Yes Historical Provider, MD  hydrOXYzine (ATARAX/VISTARIL) 25 MG tablet Take 25 mg by mouth 3 (three) times daily as needed. For itching.   Yes Historical Provider, MD  traMADol (ULTRAM) 50 MG tablet Take 1 tablet (50 mg total) by mouth every 6 (  six) hours as needed. 10/03/14  Yes Cathren Laine, MD  diazepam (VALIUM) 5 MG tablet Take 1 tablet (5 mg total) by mouth every 8 (eight) hours as needed for anxiety. 04/07/15 04/10/15  Drema Pry, MD  furosemide (LASIX) 20 MG tablet Take 1 tablet (20 mg total) by mouth 2 (two) times daily. 07/23/11 10/03/14  Nelva Nay, MD   ibuprofen (ADVIL,MOTRIN) 600 MG tablet Take 1 tablet (600 mg total) by mouth every 6 (six) hours as needed. 05/16/14   Loma Boston, MD   BP 117/65 mmHg  Pulse 60  Temp(Src) 97.4 F (36.3 C) (Oral)  Resp 14  SpO2 99% Physical Exam  Constitutional: She is oriented to person, place, and time. She appears well-developed and well-nourished. No distress.  HENT:  Head: Normocephalic and atraumatic.  Right Ear: External ear normal. Tympanic membrane is not injected. No middle ear effusion.  Left Ear: External ear normal. Tympanic membrane is not injected. A middle ear effusion is present.  Nose: Nose normal.  Eyes: Conjunctivae and EOM are normal. Pupils are equal, round, and reactive to light. Right eye exhibits no discharge. Left eye exhibits no discharge. No scleral icterus.  Neck: Normal range of motion. Neck supple.  Cardiovascular: Normal rate, regular rhythm and normal heart sounds.  Exam reveals no gallop and no friction rub.   No murmur heard. Pulmonary/Chest: Effort normal and breath sounds normal. No stridor. No respiratory distress. She has no wheezes.  Abdominal: Soft. She exhibits no distension. There is no tenderness.  Musculoskeletal: She exhibits no edema or tenderness.  Neurological: She is alert and oriented to person, place, and time.  Cranial Nerves  II Visual Fields: Intact to confrontation. Visual fields intact. III, IV, VI: Pupils equal and reactive to light and near. Full eye movement without nystagmus  V Facial Sensation: Normal. No weakness of masticatory muscles  VII: No facial weakness or asymmetry  VIII Auditory Acuity: Grossly normal  IX/X: The uvula is midline; the palate elevates symmetrically  XI: Normal sternocleidomastoid and trapezius strength  XII: The tongue is midline. No atrophy or fasciculations.   Motor System: Muscle Strength: 5/5 and symmetric in the upper and lower extremities. No pronation or drift.  Muscle Tone: Tone and muscle bulk are  normal in the upper and lower extremities.   Reflexes: DTRs: 2+ and symmetrical in all four extremities. Plantar responses are flexor bilaterally.  Coordination: Intact finger-to-nose, heel-to-shin, and rapid alternating movements. No tremor.  Sensation: Intact to light touch, vibration, and pinprick. Negative Romberg test.  Gait: Routine and tandem gait are normal   Negative Head impulse, Skew, and no nystagmus  Skin: Skin is warm and dry. No rash noted. She is not diaphoretic. No erythema.  Psychiatric: She has a normal mood and affect.    ED Course  Procedures (including critical care time) Labs Review Labs Reviewed  COMPREHENSIVE METABOLIC PANEL - Abnormal; Notable for the following:    Glucose, Bld 116 (*)    Total Protein 6.4 (*)    Albumin 3.3 (*)    All other components within normal limits  CBC WITH DIFFERENTIAL/PLATELET    Imaging Review No results found. I have personally reviewed and evaluated these images and lab results as part of my medical decision-making.   EKG Interpretation   Date/Time:  Monday April 07 2015 08:47:19 EST Ventricular Rate:  53 PR Interval:  150 QRS Duration: 96 QT Interval:  463 QTC Calculation: 435 R Axis:   27 Text Interpretation:  Sinus rhythm RSR'  in V1 or V2, probably normal  variant Baseline wander in lead(s) V2 No significant change since last  tracing Confirmed by [facility] MD, ERIN ([id number]) on 04/07/2015 8:56:41 AM      MDM   Findings most suggestive with serous labyrinthitis. Low concern for CVA or other central process at this time given the lack of focal findings on neurologic exam. We'll assess for anemia or electrolyte derangements. EKG without evidence of dysrhythmias or ischemia. Labs without anemia or significant electrolyte derangements.  Patient treated with meclizine that was unsuccessful. 5 mg of Valium given which has significant improvement of her symptoms. She is safe for discharge with strict return precautions  and close follow-up with her PCP for continued assessment and management of her symptoms.  Strict non-driving instructions given to the patient until cleared by her PCP.  Diagnostic studies interpreted by me and use to my clinical decision-making. Next  Patient seen in conjunction with Dr. Dalene Seltzer.  Final diagnoses:  Vertigo  Middle ear effusion, left        Drema Pry, MD 04/07/15 1224  Alvira Monday, MD 04/07/15 2142

## 2015-04-07 NOTE — ED Notes (Signed)
Pt arrives from home with husband. Pt reports waking up this morning w/ dizziness onset around 0615. Pt hx COPD w/ no new changes in baseline shortness of breath. Pt reports dizziness worse when turning head to left side, states "feels as if room is spinning." Pt has seasonal allergies and appears to be congested. Pt denies blurred vision or weakness. No pain at this time.

## 2015-04-07 NOTE — ED Notes (Signed)
Pt verbalized understanding of d/c instructions, prescriptions, and follow-up care. No further questions/concerns, VSS, assisted to lobby in wheelchair. Pt also instructed not to drive w/ medications prescribed and given in the ED today.

## 2015-05-13 ENCOUNTER — Emergency Department (HOSPITAL_COMMUNITY): Payer: Medicaid Other

## 2015-05-13 ENCOUNTER — Encounter (HOSPITAL_COMMUNITY): Payer: Self-pay | Admitting: Emergency Medicine

## 2015-05-13 ENCOUNTER — Emergency Department (HOSPITAL_COMMUNITY)
Admission: EM | Admit: 2015-05-13 | Discharge: 2015-05-13 | Disposition: A | Discharge status: Home / Self Care | Payer: Medicaid Other | Attending: Emergency Medicine | Admitting: Emergency Medicine

## 2015-05-13 DIAGNOSIS — J189 Pneumonia, unspecified organism: Secondary | ICD-10-CM

## 2015-05-13 DIAGNOSIS — Z8669 Personal history of other diseases of the nervous system and sense organs: Secondary | ICD-10-CM | POA: Diagnosis not present

## 2015-05-13 DIAGNOSIS — R0602 Shortness of breath: Secondary | ICD-10-CM | POA: Diagnosis present

## 2015-05-13 DIAGNOSIS — J159 Unspecified bacterial pneumonia: Secondary | ICD-10-CM | POA: Diagnosis not present

## 2015-05-13 DIAGNOSIS — F17210 Nicotine dependence, cigarettes, uncomplicated: Secondary | ICD-10-CM | POA: Insufficient documentation

## 2015-05-13 DIAGNOSIS — J441 Chronic obstructive pulmonary disease with (acute) exacerbation: Secondary | ICD-10-CM

## 2015-05-13 DIAGNOSIS — M1990 Unspecified osteoarthritis, unspecified site: Secondary | ICD-10-CM | POA: Diagnosis not present

## 2015-05-13 DIAGNOSIS — I10 Essential (primary) hypertension: Secondary | ICD-10-CM | POA: Insufficient documentation

## 2015-05-13 DIAGNOSIS — Z79899 Other long term (current) drug therapy: Secondary | ICD-10-CM | POA: Insufficient documentation

## 2015-05-13 LAB — CBC WITH DIFFERENTIAL/PLATELET
BASOS ABS: 0 10*3/uL (ref 0.0–0.1)
BASOS PCT: 1 %
Eosinophils Absolute: 0 10*3/uL (ref 0.0–0.7)
Eosinophils Relative: 1 %
HEMATOCRIT: 37.2 % (ref 36.0–46.0)
HEMOGLOBIN: 12.6 g/dL (ref 12.0–15.0)
LYMPHS PCT: 23 %
Lymphs Abs: 1.5 10*3/uL (ref 0.7–4.0)
MCH: 29.3 pg (ref 26.0–34.0)
MCHC: 33.9 g/dL (ref 30.0–36.0)
MCV: 86.5 fL (ref 78.0–100.0)
MONOS PCT: 7 %
Monocytes Absolute: 0.5 10*3/uL (ref 0.1–1.0)
NEUTROS ABS: 4.5 10*3/uL (ref 1.7–7.7)
NEUTROS PCT: 68 %
Platelets: 174 10*3/uL (ref 150–400)
RBC: 4.3 MIL/uL (ref 3.87–5.11)
RDW: 13 % (ref 11.5–15.5)
WBC: 6.5 10*3/uL (ref 4.0–10.5)

## 2015-05-13 LAB — COMPREHENSIVE METABOLIC PANEL
ALBUMIN: 3.1 g/dL — AB (ref 3.5–5.0)
ALK PHOS: 88 U/L (ref 38–126)
ALT: 17 U/L (ref 14–54)
ANION GAP: 10 (ref 5–15)
AST: 21 U/L (ref 15–41)
BILIRUBIN TOTAL: 0.5 mg/dL (ref 0.3–1.2)
BUN: 15 mg/dL (ref 6–20)
CALCIUM: 9.1 mg/dL (ref 8.9–10.3)
CO2: 22 mmol/L (ref 22–32)
Chloride: 110 mmol/L (ref 101–111)
Creatinine, Ser: 0.93 mg/dL (ref 0.44–1.00)
GFR calc Af Amer: >60 mL/min (ref >60)
Glucose, Bld: 100 mg/dL — ABNORMAL HIGH (ref 65–99)
POTASSIUM: 3.8 mmol/L (ref 3.5–5.1)
Sodium: 142 mmol/L (ref 135–145)
TOTAL PROTEIN: 6.6 g/dL (ref 6.5–8.1)

## 2015-05-13 LAB — I-STAT CG4 LACTIC ACID, ED: LACTIC ACID, VENOUS: 0.97 mmol/L (ref 0.5–2.0)

## 2015-05-13 MED ORDER — LEVOFLOXACIN IN D5W 750 MG/150ML IV SOLN
750.0000 mg | Freq: Once | INTRAVENOUS | Status: DC
  Filled 2015-05-13: qty 150

## 2015-05-13 MED ORDER — SODIUM CHLORIDE 0.9 % IV SOLN: Freq: Once | INTRAVENOUS | Status: DC

## 2015-05-13 MED ORDER — PREDNISONE 20 MG PO TABS: ORAL_TABLET | ORAL | Status: DC

## 2015-05-13 MED ORDER — MORPHINE SULFATE (PF) 4 MG/ML IV SOLN
4.0000 mg | Freq: Once | INTRAVENOUS | Status: AC
  Administered 2015-05-13: 4 mg via INTRAVENOUS
  Filled 2015-05-13: qty 1

## 2015-05-13 MED ORDER — SODIUM CHLORIDE 0.9 % IV BOLUS (SEPSIS)
1000.0000 mL | Freq: Once | INTRAVENOUS | Status: AC
  Administered 2015-05-13: 1000 mL via INTRAVENOUS

## 2015-05-13 MED ORDER — LEVOFLOXACIN 750 MG PO TABS
750.0000 mg | ORAL_TABLET | Freq: Once | ORAL | Status: AC
  Administered 2015-05-13: 750 mg via ORAL
  Filled 2015-05-13: qty 1

## 2015-05-13 MED ORDER — LEVOFLOXACIN 500 MG PO TABS: 500.0000 mg | ORAL_TABLET | Freq: Every day | ORAL | Status: DC

## 2015-05-13 MED ORDER — TRAMADOL HCL 50 MG PO TABS: 50.0000 mg | ORAL_TABLET | Freq: Four times a day (QID) | ORAL | Status: AC | PRN

## 2015-05-13 MED ORDER — IPRATROPIUM-ALBUTEROL 0.5-2.5 (3) MG/3ML IN SOLN
3.0000 mL | Freq: Once | RESPIRATORY_TRACT | Status: AC
  Administered 2015-05-13: 3 mL via RESPIRATORY_TRACT
  Filled 2015-05-13: qty 3

## 2015-05-13 NOTE — ED Notes (Signed)
Onset 1-2 weeks cough and shortness of breath. States intermittent productive cough green sputum. Recently treated for left ear infection.

## 2015-05-13 NOTE — ED Notes (Signed)
EKG completed given to EDP.  

## 2015-05-13 NOTE — Discharge Instructions (Signed)
Take levaquin daily for a week.   Take prednisone as prescribed.   Use albuterol every 4 hrs as needed.   Take tramadol as needed for pain.   See your doctor   Return to ER if you have worse shortness of breath, chest pain, trouble breathing, fevers.

## 2015-05-13 NOTE — ED Provider Notes (Signed)
CSN: [id number]     Arrival date & time 05/13/15  1252 History   First MD Initiated Contact with Patient 05/13/15 1254     Chief Complaint  Patient presents with  . Shortness of Breath     (Consider location/radiation/quality/duration/timing/severity/associated sxs/prior Treatment) The history is provided by the patient.  Nakina Spatz is a 55 y.o. female hx of COPD still smoking, HTN, here with shortness of breath, cough. Productive cough with greenish sputum for the last week or so. Also has some worsening shortness of breath. She is still smokes about 10 cigarettes a day. She was seen in the ED month ago for dizziness and was prescribed Valium and also saw primary care doctor about month ago and was diagnosed with ear infection and finished Augmentin. Denies any fevers.    Past Medical History  Diagnosis Date  . Carpal tunnel syndrome   . Hypertension   . Arthritis     rheumatoid and osteo  . COPD (chronic obstructive pulmonary disease) [facility])    Past Surgical History  Procedure Laterality Date  . Tubal ligation    . Wrist sugery     No family history on file. Social History  Substance Use Topics  . Smoking status: Current Every Day Smoker -- 0.50 packs/day  . Smokeless tobacco: None  . Alcohol Use: No   OB History    No data available     Review of Systems  Respiratory: Positive for cough and shortness of breath.   All other systems reviewed and are negative.     Allergies  Review of patient's allergies indicates no known allergies.  Home Medications   Prior to Admission medications   Medication Sig Start Date End Date Taking? Authorizing Provider  acetaminophen-codeine (TYLENOL #3) 300-30 MG per tablet Take 1 tablet by mouth daily as needed for moderate pain.  07/04/14  Yes Historical Provider, MD  albuterol (PROVENTIL HFA;VENTOLIN HFA) 108 (90 BASE) MCG/ACT inhaler Inhale 2 puffs into the lungs every 6 (six) hours as needed. Shortness of breath   Yes  Historical Provider, MD  beclomethasone (QVAR) 80 MCG/ACT inhaler Inhale 1 puff into the lungs as needed. Prevent asthma   Yes Historical Provider, MD  gabapentin (NEURONTIN) 600 MG tablet Take 1 tablet (600 mg total) by mouth 3 (three) times daily. 03/26/12  Yes Jethro Bastos, NP  hydroxychloroquine (PLAQUENIL) 200 MG tablet Take 200 mg by mouth daily.   Yes Historical Provider, MD  hydrOXYzine (ATARAX/VISTARIL) 25 MG tablet Take 25 mg by mouth 3 (three) times daily as needed. For itching.   Yes Historical Provider, MD  ibuprofen (ADVIL,MOTRIN) 600 MG tablet Take 1 tablet (600 mg total) by mouth every 6 (six) hours as needed. 05/16/14  Yes Loma Boston, MD  traMADol (ULTRAM) 50 MG tablet Take 1 tablet (50 mg total) by mouth every 6 (six) hours as needed. 10/03/14  Yes Cathren Laine, MD  furosemide (LASIX) 20 MG tablet Take 1 tablet (20 mg total) by mouth 2 (two) times daily. 07/23/11 10/03/14  Nelva Nay, MD   BP 127/74 mmHg  Pulse 56  Temp(Src) 98.2 F (36.8 C) (Oral)  Resp 16  Ht 5\' 5"  (1.651 m)  Wt 232 lb (105.235 kg)  BMI 38.61 kg/m2  SpO2 98% Physical Exam  Constitutional: She is oriented to person, place, and time.  Coughing, uncomfortable   HENT:  Head: Normocephalic.  Mouth/Throat: Oropharynx is clear and moist.  Eyes: Conjunctivae are normal. Pupils are equal, round, and reactive to light.  Neck: Normal range of motion. Neck supple.  Cardiovascular: Normal rate, regular rhythm and normal heart sounds.   Pulmonary/Chest:  + crackles and wheezing R base, no retractions. + tenderness R lower ribs   Abdominal: Soft. Bowel sounds are normal.  Musculoskeletal: Normal range of motion. She exhibits no edema or tenderness.  Neurological: She is alert and oriented to person, place, and time. No cranial nerve deficit. Coordination normal.  Skin: Skin is warm and dry.  Psychiatric: She has a normal mood and affect. Her behavior is normal. Thought content normal.  Nursing note and  vitals reviewed.   ED Course  Procedures (including critical care time) Labs Review Labs Reviewed  COMPREHENSIVE METABOLIC PANEL - Abnormal; Notable for the following:    Glucose, Bld 100 (*)    Albumin 3.1 (*)    All other components within normal limits  CBC WITH DIFFERENTIAL/PLATELET  I-STAT TROPOININ, ED  I-STAT CG4 LACTIC ACID, ED    Imaging Review Dg Chest 2 View  05/13/2015  CLINICAL DATA:  Shortness of breath for 2 weeks, sharp pain under RIGHT breast today when taking a deep breath, history hypertension, COPD, smoking EXAM: CHEST  2 VIEW COMPARISON:  Prior exam is not currently available for comparison. FINDINGS: Borderline enlargement of cardiac silhouette with pulmonary vascular congestion. Mediastinal contours normal. Mild RIGHT basilar atelectasis. Lungs otherwise clear. No pleural effusion or pneumothorax. Thoracolumbar scoliosis, biconvex. IMPRESSION: Borderline enlargement of cardiac silhouette with pulmonary vascular congestion. Mild RIGHT basilar atelectasis. Electronically Signed   By: Ulyses Southward M.D.   On: 05/13/2015 13:48   I have personally reviewed and evaluated these images and lab results as part of my medical decision-making.   EKG Interpretation   Date/Time:  Tuesday May 13 2015 12:55:06 EDT Ventricular Rate:  54 PR Interval:  152 QRS Duration: 101 QT Interval:  445 QTC Calculation: 422 R Axis:   26 Text Interpretation:  Sinus rhythm Low voltage, precordial leads RSR' in  V1 or V2, right VCD or RVH No significant change since last tracing  Confirmed by YAO  MD, DAVID ([id number]) on 05/13/2015 1:03:56 PM      MDM   Final diagnoses:  None   Aidan Caloca is a 55 y.o. female here with cough, shortness of breath. Likely pneumonia vs COPD. Will get labs, CXR. Will give albuterol and pain meds and reassess.   4:10 PM Patient's CXR showed R base atelectasis. WBC nl. Has pneumonia clinically. Given levaquin. Will dc home with levaquin, prednisone,  albuterol (has neb at home)   Richardean Canal, MD 05/13/15 [phone]

## 2015-09-19 ENCOUNTER — Encounter (HOSPITAL_COMMUNITY): Payer: Self-pay | Admitting: Emergency Medicine

## 2015-09-19 DIAGNOSIS — J449 Chronic obstructive pulmonary disease, unspecified: Secondary | ICD-10-CM | POA: Diagnosis not present

## 2015-09-19 DIAGNOSIS — F17200 Nicotine dependence, unspecified, uncomplicated: Secondary | ICD-10-CM | POA: Diagnosis not present

## 2015-09-19 DIAGNOSIS — I10 Essential (primary) hypertension: Secondary | ICD-10-CM | POA: Insufficient documentation

## 2015-09-19 DIAGNOSIS — M069 Rheumatoid arthritis, unspecified: Secondary | ICD-10-CM | POA: Diagnosis present

## 2015-09-19 MED ORDER — OXYCODONE-ACETAMINOPHEN 5-325 MG PO TABS
ORAL_TABLET | ORAL | Status: DC
Start: 2015-09-19 — End: 2015-09-20
  Filled 2015-09-19: qty 1

## 2015-09-19 MED ORDER — OXYCODONE-ACETAMINOPHEN 5-325 MG PO TABS
1.0000 | ORAL_TABLET | Freq: Once | ORAL | Status: AC
Start: 2015-09-20 — End: 2015-09-19
  Administered 2015-09-19: 1 via ORAL

## 2015-09-19 NOTE — ED Triage Notes (Signed)
Pt. reports arthritic pain at right knee with swelling onset today , received Cortizone injection at right knee at [facility] clinic today with no relief .

## 2015-09-20 ENCOUNTER — Emergency Department (HOSPITAL_COMMUNITY): Payer: Medicaid Other

## 2015-09-20 ENCOUNTER — Emergency Department (HOSPITAL_COMMUNITY)
Admission: EM | Admit: 2015-09-20 | Discharge: 2015-09-20 | Disposition: A | Discharge status: Home / Self Care | Payer: Medicaid Other | Attending: Emergency Medicine | Admitting: Emergency Medicine

## 2015-09-20 DIAGNOSIS — M069 Rheumatoid arthritis, unspecified: Secondary | ICD-10-CM

## 2015-09-20 LAB — CBC WITH DIFFERENTIAL/PLATELET
BASOS ABS: 0 10*3/uL (ref 0.0–0.1)
Basophils Relative: 0 %
EOS PCT: 0 %
Eosinophils Absolute: 0 10*3/uL (ref 0.0–0.7)
HCT: 40.4 % (ref 36.0–46.0)
HEMOGLOBIN: 13.8 g/dL (ref 12.0–15.0)
LYMPHS ABS: 0.6 10*3/uL — AB (ref 0.7–4.0)
Lymphocytes Relative: 8 %
MCH: 30.3 pg (ref 26.0–34.0)
MCHC: 34.2 g/dL (ref 30.0–36.0)
MCV: 88.8 fL (ref 78.0–100.0)
Monocytes Absolute: 0.1 10*3/uL (ref 0.1–1.0)
Monocytes Relative: 1 %
NEUTROS PCT: 91 %
Neutro Abs: 6.4 10*3/uL (ref 1.7–7.7)
PLATELETS: 188 10*3/uL (ref 150–400)
RBC: 4.55 MIL/uL (ref 3.87–5.11)
RDW: 13.4 % (ref 11.5–15.5)
WBC: 7.1 10*3/uL (ref 4.0–10.5)

## 2015-09-20 LAB — COMPREHENSIVE METABOLIC PANEL
ALK PHOS: 96 U/L (ref 38–126)
ALT: 15 U/L (ref 14–54)
AST: 22 U/L (ref 15–41)
Albumin: 3.7 g/dL (ref 3.5–5.0)
Anion gap: 7 (ref 5–15)
BUN: 13 mg/dL (ref 6–20)
CALCIUM: 9.4 mg/dL (ref 8.9–10.3)
CHLORIDE: 110 mmol/L (ref 101–111)
CO2: 21 mmol/L — AB (ref 22–32)
CREATININE: 0.9 mg/dL (ref 0.44–1.00)
GFR calc non Af Amer: >60 mL/min (ref >60)
Glucose, Bld: 152 mg/dL — ABNORMAL HIGH (ref 65–99)
Potassium: 4.1 mmol/L (ref 3.5–5.1)
SODIUM: 138 mmol/L (ref 135–145)
Total Bilirubin: 0.4 mg/dL (ref 0.3–1.2)
Total Protein: 7.5 g/dL (ref 6.5–8.1)

## 2015-09-20 MED ORDER — PREDNISONE 10 MG (21) PO TBPK: 10.0000 mg | ORAL_TABLET | Freq: Every day | ORAL | 0 refills | Status: AC

## 2015-09-20 MED ORDER — ONDANSETRON 4 MG PO TBDP
4.0000 mg | ORAL_TABLET | Freq: Once | ORAL | Status: AC
  Administered 2015-09-20: 4 mg via ORAL
  Filled 2015-09-20: qty 1

## 2015-09-20 MED ORDER — OXYCODONE-ACETAMINOPHEN 5-325 MG PO TABS: 1.0000 | ORAL_TABLET | ORAL | 0 refills | Status: AC | PRN

## 2015-09-20 MED ORDER — DEXAMETHASONE SODIUM PHOSPHATE 10 MG/ML IJ SOLN
10.0000 mg | Freq: Once | INTRAMUSCULAR | Status: AC
  Administered 2015-09-20: 10 mg via INTRAMUSCULAR
  Filled 2015-09-20: qty 1

## 2015-09-20 MED ORDER — HYDROMORPHONE HCL 1 MG/ML IJ SOLN
1.0000 mg | Freq: Once | INTRAMUSCULAR | Status: AC
  Administered 2015-09-20: 1 mg via INTRAMUSCULAR
  Filled 2015-09-20: qty 1

## 2015-09-20 MED ORDER — MORPHINE SULFATE (PF) 4 MG/ML IV SOLN
4.0000 mg | Freq: Once | INTRAVENOUS | Status: AC
  Administered 2015-09-20: 4 mg via INTRAMUSCULAR
  Filled 2015-09-20: qty 1

## 2015-09-20 NOTE — ED Notes (Signed)
Pt departed in NAD.  

## 2015-09-20 NOTE — ED Provider Notes (Signed)
[facility]-EMERGENCY DEPT Provider Note   CSN: [id number] Arrival date & time: 09/19/15  2329  By signing my name below, I, Majel Homer, attest that this documentation has been prepared under the direction and in the presence of Jacalyn Lefevre, MD . Electronically Signed: Majel Homer, Scribe. 09/20/2015. 3:02 AM.  History   Chief Complaint Chief Complaint  Patient presents with  . Rheumatoid Arthritis   The history is provided by the patient. No language interpreter was used.   HPI Comments: Michele Hogan is a 55 y.o. female with PMHx of rheumatoid arthritis, carpal tunnel syndrome and HTN, who presents to the Emergency Department complaining of gradually worsening, pain to her right knee that began at ~10:00 AM this morning. Pt reports her pain begins in her right knee and radiates to her right foot with associated right knee swelling. She states she usually takes tylenol, ibuprofen and hydrocodone to relieve her pain; however, she received a Cortizone shot at her [facility] clinic today with no relief. She denies hx of DM and numbness in her right foot.    Past Medical History:  Diagnosis Date  . Arthritis    rheumatoid and osteo  . Carpal tunnel syndrome   . COPD (chronic obstructive pulmonary disease) (HCC)   . Hypertension    There are no active problems to display for this patient.  Past Surgical History:  Procedure Laterality Date  . TUBAL LIGATION    . wrist sugery      OB History    No data available       Home Medications    Prior to Admission medications   Medication Sig Start Date End Date Taking? Authorizing Provider  acetaminophen-codeine (TYLENOL #3) 300-30 MG per tablet Take 1 tablet by mouth daily as needed for moderate pain.  07/04/14   Historical Provider, MD  albuterol (PROVENTIL HFA;VENTOLIN HFA) 108 (90 BASE) MCG/ACT inhaler Inhale 2 puffs into the lungs every 6 (six) hours as needed. Shortness of breath    Historical Provider, MD  beclomethasone (QVAR) 80 MCG/ACT  inhaler Inhale 1 puff into the lungs as needed. Prevent asthma    Historical Provider, MD  furosemide (LASIX) 20 MG tablet Take 1 tablet (20 mg total) by mouth 2 (two) times daily. 07/23/11 10/03/14  Nelva Nay, MD  gabapentin (NEURONTIN) 600 MG tablet Take 1 tablet (600 mg total) by mouth 3 (three) times daily. 03/26/12   Jethro Bastos, NP  hydroxychloroquine (PLAQUENIL) 200 MG tablet Take 200 mg by mouth daily.    Historical Provider, MD  hydrOXYzine (ATARAX/VISTARIL) 25 MG tablet Take 25 mg by mouth 3 (three) times daily as needed. For itching.    Historical Provider, MD  ibuprofen (ADVIL,MOTRIN) 600 MG tablet Take 1 tablet (600 mg total) by mouth every 6 (six) hours as needed. 05/16/14   Loma Boston, MD  levofloxacin (LEVAQUIN) 500 MG tablet Take 1 tablet (500 mg total) by mouth daily. 05/13/15   Charlynne Pander, MD  oxyCODONE-acetaminophen (PERCOCET/ROXICET) 5-325 MG tablet Take 1 tablet by mouth every 4 (four) hours as needed for severe pain. 09/20/15   Jacalyn Lefevre, MD  predniSONE (STERAPRED UNI-PAK 21 TAB) 10 MG (21) TBPK tablet Take 1 tablet (10 mg total) by mouth daily. Take 6 tabs by mouth daily  for 2 days, then 5 tabs for 2 days, then 4 tabs for 2 days, then 3 tabs for 2 days, 2 tabs for 2 days, then 1 tab by mouth daily for 2 days 09/20/15   Raynelle Fanning  Particia Nearing, MD  traMADol (ULTRAM) 50 MG tablet Take 1 tablet (50 mg total) by mouth every 6 (six) hours as needed. 05/13/15   Charlynne Pander, MD   Family History No family history on file.  Social History Social History  Substance Use Topics  . Smoking status: Current Every Day Smoker    Packs/day: 0.50  . Smokeless tobacco: Not on file  . Alcohol use No   Allergies   Review of patient's allergies indicates no known allergies.  Review of Systems Review of Systems  Musculoskeletal: Positive for arthralgias and joint swelling.  Neurological: Negative for numbness.   Physical Exam Updated Vital Signs BP 144/72 (BP Location:  Left Arm)   Pulse 62   Temp 98.8 F (37.1 C) (Oral)   Resp 24   Ht 5\' 5"  (1.651 m)   Wt 234 lb (106.1 kg)   SpO2 95%   BMI 38.94 kg/m   Physical Exam  Constitutional: She is oriented to person, place, and time. She appears well-developed and well-nourished.  HENT:  Head: Normocephalic and atraumatic.  Eyes: Conjunctivae are normal. Pupils are equal, round, and reactive to light. Right eye exhibits no discharge. Left eye exhibits no discharge. No scleral icterus.  Neck: Normal range of motion. No JVD present. No tracheal deviation present.  Pulmonary/Chest: Effort normal. No stridor.  Musculoskeletal: She exhibits tenderness.  Swelling to her right knee; pain with ROM; TTP; no redness   Neurological: She is alert and oriented to person, place, and time. Coordination normal.  Psychiatric: She has a normal mood and affect. Her behavior is normal. Judgment and thought content normal.  Nursing note and vitals reviewed.  ED Treatments / Results  Labs (all labs ordered are listed, but only abnormal results are displayed) Labs Reviewed  CBC WITH DIFFERENTIAL/PLATELET - Abnormal; Notable for the following:       Result Value   Lymphs Abs 0.6 (*)    All other components within normal limits  COMPREHENSIVE METABOLIC PANEL - Abnormal; Notable for the following:    CO2 21 (*)    Glucose, Bld 152 (*)    All other components within normal limits    EKG  EKG Interpretation None      Radiology Dg Knee Complete 4 Views Right  Result Date: 09/20/2015 CLINICAL DATA:  Knee pain and edema.  Cortisone shot yesterday. EXAM: RIGHT KNEE - COMPLETE 4+ VIEW COMPARISON:  09/17/2011 FINDINGS: No evidence of fracture, dislocation, or joint effusion. Tricompartmental osteoarthritis with diffuse joint narrowing, subchondral cysts, and spurring. Osteopenia. IMPRESSION: 1. No acute finding. 2. Tricompartmental osteoarthritis with generalized progression compared to 2013. Electronically Signed   By:  2014 M.D.   On: 09/20/2015 00:49   Procedures Procedures  DIAGNOSTIC STUDIES:  Oxygen Saturation is 95% on RA, adequate by my interpretation.    COORDINATION OF CARE:  3:01 AM Discussed treatment plan with pt at bedside and pt agreed to plan.  Medications Ordered in ED Medications  oxyCODONE-acetaminophen (PERCOCET/ROXICET) 5-325 MG per tablet (not administered)  oxyCODONE-acetaminophen (PERCOCET/ROXICET) 5-325 MG per tablet 1 tablet (1 tablet Oral Given 09/19/15 2353)  dexamethasone (DECADRON) injection 10 mg (10 mg Intramuscular Given 09/20/15 0319)  morphine 4 MG/ML injection 4 mg (4 mg Intramuscular Given 09/20/15 0323)  ondansetron (ZOFRAN-ODT) disintegrating tablet 4 mg (4 mg Oral Given 09/20/15 0315)  HYDROmorphone (DILAUDID) injection 1 mg (1 mg Intramuscular Given 09/20/15 0435)   Initial Impression / Assessment and Plan / ED Course  I have reviewed the triage  vital signs and the nursing notes.  Pertinent labs & imaging results that were available during my care of the patient were reviewed by me and considered in my medical decision making (see chart for details).  Clinical Course   Pt's pain is much improved.  She knows to call Dr. Herma Carson. In to morning and to return if worse.  I personally performed the services described in this documentation, which was scribed in my presence. The recorded information has been reviewed and is accurate.   Final Clinical Impressions(s) / ED Diagnoses   Final diagnoses:  Rheumatoid arthritis flare (HCC)    New Prescriptions New Prescriptions   OXYCODONE-ACETAMINOPHEN (PERCOCET/ROXICET) 5-325 MG TABLET    Take 1 tablet by mouth every 4 (four) hours as needed for severe pain.   PREDNISONE (STERAPRED UNI-PAK 21 TAB) 10 MG (21) TBPK TABLET    Take 1 tablet (10 mg total) by mouth daily. Take 6 tabs by mouth daily  for 2 days, then 5 tabs for 2 days, then 4 tabs for 2 days, then 3 tabs for 2 days, 2 tabs for 2 days, then 1 tab by mouth  daily for 2 days     Jacalyn Lefevre, MD 09/20/15 [id number]

## 2017-04-12 IMAGING — MG MM DIGITAL SCREENING BILAT W/ CAD
5 series · 5 of 5 positions shown · non-contrast
Comparison: Previous exam(s).

CLINICAL DATA: Screening.

EXAM:
DIGITAL SCREENING BILATERAL MAMMOGRAM WITH CAD

[L MLO]
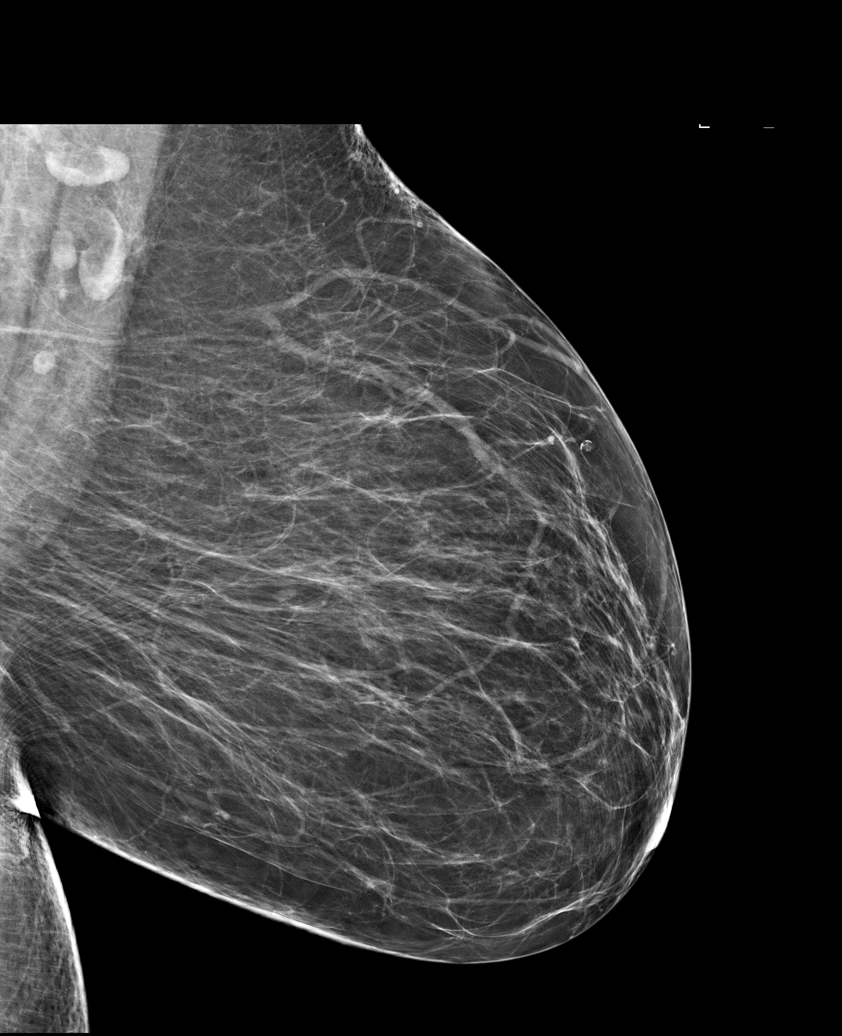

[L CC]
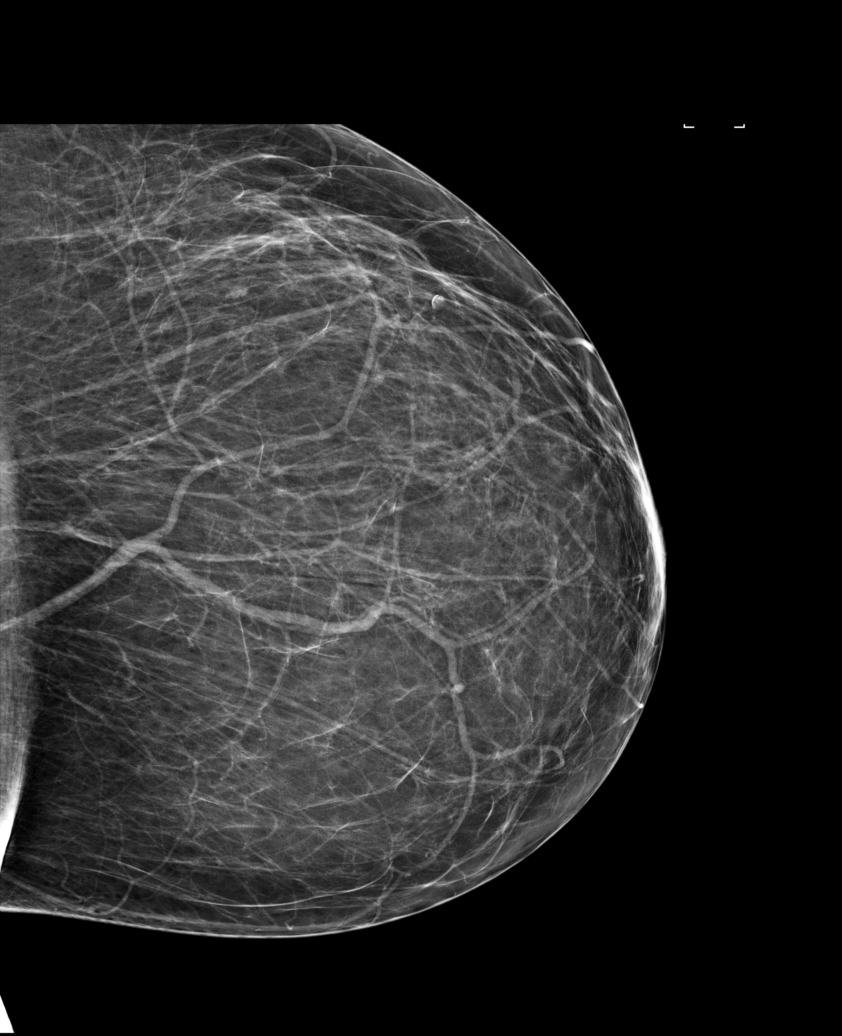

[R MLO]
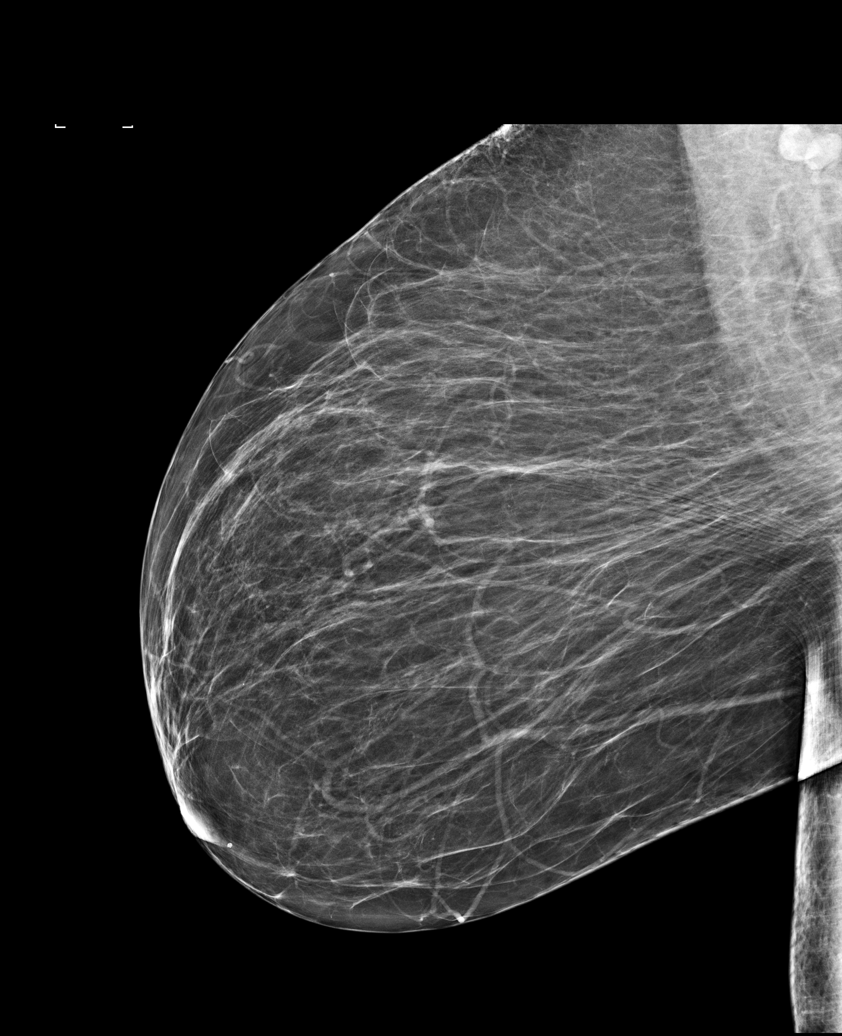

[R CC]
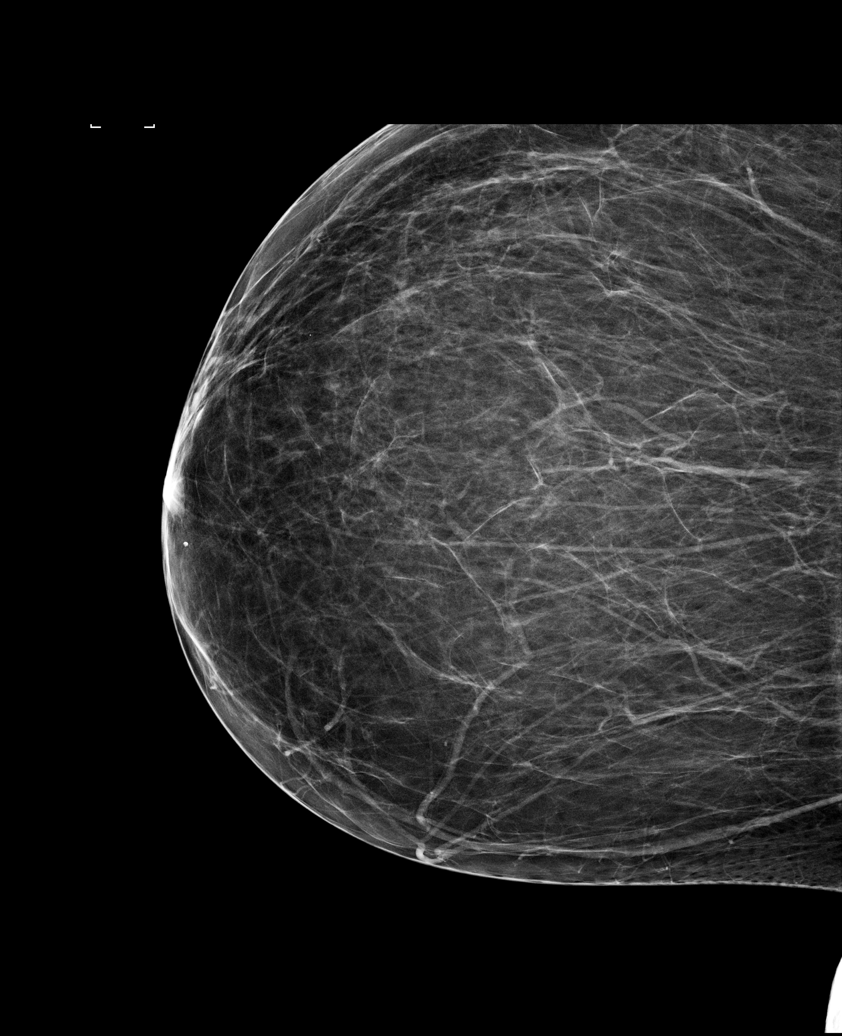

[L CV]
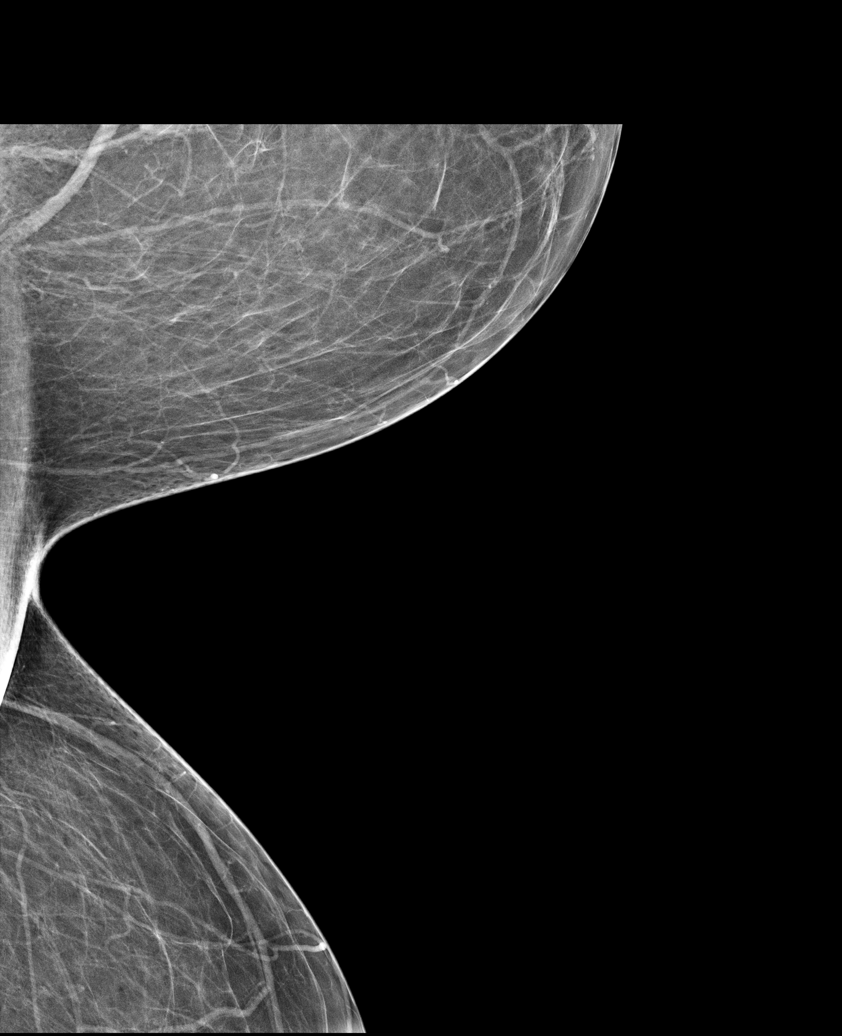

[5 of 5 positions shown; findings below may reference images not displayed]

ACR Breast Density Category b: There are scattered areas of
fibroglandular density.
FINDINGS: There are no findings suspicious for malignancy. Images were
processed with CAD.
IMPRESSION: No mammographic evidence of malignancy. A result letter of this
screening mammogram will be mailed directly to the patient.

RECOMMENDATION:
Screening mammogram in one year. (Code:AS-G-LCT)

BI-RADS CATEGORY  1: Negative.
